# Patient Record
Sex: Female | Born: 1980 | Race: Black or African American | Hispanic: No | Smoking: Current every day smoker
Health system: Southern US, Community
[De-identification: ages and names within clinical notes are randomized; demographics above are authoritative.]

## PROBLEM LIST (undated history)

## (undated) ENCOUNTER — Inpatient Hospital Stay (HOSPITAL_COMMUNITY): Payer: Self-pay

## (undated) DIAGNOSIS — F329 Major depressive disorder, single episode, unspecified: Secondary | ICD-10-CM

## (undated) DIAGNOSIS — K219 Gastro-esophageal reflux disease without esophagitis: Secondary | ICD-10-CM

## (undated) DIAGNOSIS — F319 Bipolar disorder, unspecified: Secondary | ICD-10-CM

## (undated) DIAGNOSIS — N946 Dysmenorrhea, unspecified: Secondary | ICD-10-CM

## (undated) DIAGNOSIS — F32A Depression, unspecified: Secondary | ICD-10-CM

## (undated) DIAGNOSIS — IMO0002 Reserved for concepts with insufficient information to code with codable children: Secondary | ICD-10-CM

## (undated) DIAGNOSIS — F419 Anxiety disorder, unspecified: Secondary | ICD-10-CM

## (undated) DIAGNOSIS — D649 Anemia, unspecified: Secondary | ICD-10-CM

## (undated) HISTORY — DX: Bipolar disorder, unspecified: F31.9

## (undated) HISTORY — DX: Dysmenorrhea, unspecified: N94.6

## (undated) HISTORY — DX: Reserved for concepts with insufficient information to code with codable children: IMO0002

---

## 2000-09-10 HISTORY — PX: CHOLECYSTECTOMY: SHX55

## 2001-07-01 ENCOUNTER — Emergency Department (HOSPITAL_COMMUNITY): Admission: EM | Admit: 2001-07-01 | Discharge: 2001-07-01 | Payer: Self-pay | Admitting: Emergency Medicine

## 2001-07-01 ENCOUNTER — Encounter: Payer: Self-pay | Admitting: *Deleted

## 2002-05-20 ENCOUNTER — Emergency Department (HOSPITAL_COMMUNITY): Admission: EM | Admit: 2002-05-20 | Discharge: 2002-05-20 | Payer: Self-pay | Admitting: Emergency Medicine

## 2002-05-21 ENCOUNTER — Encounter: Payer: Self-pay | Admitting: Family Medicine

## 2002-05-21 ENCOUNTER — Ambulatory Visit (HOSPITAL_COMMUNITY): Admission: RE | Admit: 2002-05-21 | Discharge: 2002-05-21 | Payer: Self-pay | Admitting: Family Medicine

## 2002-05-22 ENCOUNTER — Encounter: Payer: Self-pay | Admitting: Internal Medicine

## 2002-05-22 ENCOUNTER — Inpatient Hospital Stay (HOSPITAL_COMMUNITY): Admission: EM | Admit: 2002-05-22 | Discharge: 2002-05-30 | Payer: Self-pay | Admitting: Internal Medicine

## 2002-05-25 ENCOUNTER — Encounter: Payer: Self-pay | Admitting: Family Medicine

## 2002-09-16 ENCOUNTER — Emergency Department (HOSPITAL_COMMUNITY): Admission: EM | Admit: 2002-09-16 | Discharge: 2002-09-16 | Payer: Self-pay | Admitting: Emergency Medicine

## 2003-04-16 ENCOUNTER — Emergency Department (HOSPITAL_COMMUNITY): Admission: EM | Admit: 2003-04-16 | Discharge: 2003-04-16 | Payer: Self-pay | Admitting: Emergency Medicine

## 2003-05-26 ENCOUNTER — Emergency Department (HOSPITAL_COMMUNITY): Admission: EM | Admit: 2003-05-26 | Discharge: 2003-05-26 | Payer: Self-pay | Admitting: Emergency Medicine

## 2003-10-13 ENCOUNTER — Ambulatory Visit (HOSPITAL_COMMUNITY): Admission: RE | Admit: 2003-10-13 | Discharge: 2003-10-13 | Payer: Self-pay | Admitting: Family Medicine

## 2004-03-22 ENCOUNTER — Ambulatory Visit (HOSPITAL_COMMUNITY): Admission: RE | Admit: 2004-03-22 | Discharge: 2004-03-22 | Payer: Self-pay | Admitting: Family Medicine

## 2004-07-04 ENCOUNTER — Ambulatory Visit (HOSPITAL_COMMUNITY): Admission: RE | Admit: 2004-07-04 | Discharge: 2004-07-04 | Payer: Self-pay | Admitting: Family Medicine

## 2004-10-19 ENCOUNTER — Ambulatory Visit: Payer: Self-pay | Admitting: Family Medicine

## 2004-11-16 ENCOUNTER — Ambulatory Visit: Payer: Self-pay | Admitting: Family Medicine

## 2005-01-10 ENCOUNTER — Ambulatory Visit: Payer: Self-pay | Admitting: Family Medicine

## 2005-03-28 ENCOUNTER — Ambulatory Visit: Payer: Self-pay | Admitting: Family Medicine

## 2005-08-16 ENCOUNTER — Ambulatory Visit: Payer: Self-pay | Admitting: Family Medicine

## 2005-09-20 ENCOUNTER — Ambulatory Visit: Payer: Self-pay | Admitting: Family Medicine

## 2005-10-23 ENCOUNTER — Ambulatory Visit: Payer: Self-pay | Admitting: Family Medicine

## 2005-12-11 ENCOUNTER — Ambulatory Visit: Payer: Self-pay | Admitting: Family Medicine

## 2006-01-03 ENCOUNTER — Ambulatory Visit: Payer: Self-pay | Admitting: Family Medicine

## 2006-04-01 ENCOUNTER — Ambulatory Visit: Payer: Self-pay | Admitting: Family Medicine

## 2006-07-01 ENCOUNTER — Ambulatory Visit: Payer: Self-pay | Admitting: Family Medicine

## 2006-10-28 ENCOUNTER — Encounter: Payer: Self-pay | Admitting: Family Medicine

## 2006-10-28 ENCOUNTER — Ambulatory Visit: Payer: Self-pay | Admitting: Family Medicine

## 2006-10-28 ENCOUNTER — Other Ambulatory Visit: Admission: RE | Admit: 2006-10-28 | Discharge: 2006-10-28 | Payer: Self-pay | Admitting: Family Medicine

## 2006-10-29 ENCOUNTER — Encounter: Payer: Self-pay | Admitting: Family Medicine

## 2006-10-29 LAB — CONVERTED CEMR LAB: Candida species: POSITIVE — AB

## 2006-12-31 ENCOUNTER — Ambulatory Visit: Payer: Self-pay | Admitting: Family Medicine

## 2007-01-01 ENCOUNTER — Encounter: Payer: Self-pay | Admitting: Family Medicine

## 2007-01-01 LAB — CONVERTED CEMR LAB
GC Probe Amp, Genital: NEGATIVE
Gardnerella vaginalis: POSITIVE — AB
Trichomonal Vaginitis: NEGATIVE

## 2007-04-09 ENCOUNTER — Ambulatory Visit: Payer: Self-pay | Admitting: Family Medicine

## 2007-05-28 ENCOUNTER — Ambulatory Visit: Payer: Self-pay | Admitting: Family Medicine

## 2007-05-29 ENCOUNTER — Encounter: Payer: Self-pay | Admitting: Family Medicine

## 2007-05-29 LAB — CONVERTED CEMR LAB
Candida species: NEGATIVE
Chlamydia, DNA Probe: NEGATIVE

## 2007-06-16 ENCOUNTER — Ambulatory Visit: Payer: Self-pay | Admitting: Family Medicine

## 2007-09-23 ENCOUNTER — Ambulatory Visit: Payer: Self-pay | Admitting: Family Medicine

## 2007-09-23 LAB — CONVERTED CEMR LAB
ALT: 11 units/L (ref 0–35)
AST: 14 units/L (ref 0–37)
BUN: 14 mg/dL (ref 6–23)
Bilirubin, Direct: 0.1 mg/dL (ref 0.0–0.3)
Calcium: 9.7 mg/dL (ref 8.4–10.5)
Cholesterol: 153 mg/dL (ref 0–200)
Creatinine, Ser: 0.77 mg/dL (ref 0.40–1.20)
Eosinophils Absolute: 0.1 10*3/uL (ref 0.0–0.7)
Eosinophils Relative: 1 % (ref 0–5)
Glucose, Bld: 93 mg/dL (ref 70–99)
HCT: 42.9 % (ref 36.0–46.0)
Helicobacter Pylori Antibody-IgG: <0.4
Hemoglobin: 14.6 g/dL (ref 12.0–15.0)
Indirect Bilirubin: 0.4 mg/dL (ref 0.0–0.9)
Lymphs Abs: 3 10*3/uL (ref 0.7–4.0)
MCV: 96.2 fL (ref 78.0–100.0)
Monocytes Absolute: 0.6 10*3/uL (ref 0.1–1.0)
Monocytes Relative: 7 % (ref 3–12)
Platelets: 294 10*3/uL (ref 150–400)
RBC: 4.46 M/uL (ref 3.87–5.11)
Total Bilirubin: 0.5 mg/dL (ref 0.3–1.2)
WBC: 9.2 10*3/uL (ref 4.0–10.5)

## 2007-09-24 ENCOUNTER — Encounter: Payer: Self-pay | Admitting: Family Medicine

## 2007-09-24 LAB — CONVERTED CEMR LAB
Candida species: NEGATIVE
Gardnerella vaginalis: POSITIVE — AB
Trichomonal Vaginitis: NEGATIVE

## 2007-09-25 ENCOUNTER — Encounter: Payer: Self-pay | Admitting: Family Medicine

## 2007-11-12 ENCOUNTER — Ambulatory Visit: Payer: Self-pay | Admitting: Family Medicine

## 2007-11-12 ENCOUNTER — Encounter: Payer: Self-pay | Admitting: Family Medicine

## 2007-11-12 ENCOUNTER — Other Ambulatory Visit: Admission: RE | Admit: 2007-11-12 | Discharge: 2007-11-12 | Payer: Self-pay | Admitting: Family Medicine

## 2007-11-13 ENCOUNTER — Encounter: Payer: Self-pay | Admitting: Family Medicine

## 2007-11-13 LAB — CONVERTED CEMR LAB
Candida species: POSITIVE — AB
GC Probe Amp, Genital: NEGATIVE

## 2008-02-12 DIAGNOSIS — E669 Obesity, unspecified: Secondary | ICD-10-CM | POA: Insufficient documentation

## 2008-02-12 DIAGNOSIS — F329 Major depressive disorder, single episode, unspecified: Secondary | ICD-10-CM | POA: Insufficient documentation

## 2008-02-12 DIAGNOSIS — R109 Unspecified abdominal pain: Secondary | ICD-10-CM | POA: Insufficient documentation

## 2008-02-12 DIAGNOSIS — K3189 Other diseases of stomach and duodenum: Secondary | ICD-10-CM | POA: Insufficient documentation

## 2008-02-12 DIAGNOSIS — M67919 Unspecified disorder of synovium and tendon, unspecified shoulder: Secondary | ICD-10-CM | POA: Insufficient documentation

## 2008-02-12 DIAGNOSIS — R1013 Epigastric pain: Secondary | ICD-10-CM

## 2008-02-12 DIAGNOSIS — N760 Acute vaginitis: Secondary | ICD-10-CM | POA: Insufficient documentation

## 2008-02-12 DIAGNOSIS — F3289 Other specified depressive episodes: Secondary | ICD-10-CM | POA: Insufficient documentation

## 2008-02-12 DIAGNOSIS — M719 Bursopathy, unspecified: Secondary | ICD-10-CM

## 2008-02-12 DIAGNOSIS — F17200 Nicotine dependence, unspecified, uncomplicated: Secondary | ICD-10-CM | POA: Insufficient documentation

## 2008-04-14 ENCOUNTER — Ambulatory Visit: Payer: Self-pay | Admitting: Family Medicine

## 2008-04-14 ENCOUNTER — Encounter: Payer: Self-pay | Admitting: Family Medicine

## 2008-04-14 DIAGNOSIS — R110 Nausea: Secondary | ICD-10-CM

## 2008-08-16 ENCOUNTER — Ambulatory Visit: Payer: Self-pay | Admitting: Family Medicine

## 2008-08-16 DIAGNOSIS — N3000 Acute cystitis without hematuria: Secondary | ICD-10-CM | POA: Insufficient documentation

## 2008-08-16 DIAGNOSIS — IMO0002 Reserved for concepts with insufficient information to code with codable children: Secondary | ICD-10-CM

## 2008-08-16 DIAGNOSIS — M1710 Unilateral primary osteoarthritis, unspecified knee: Secondary | ICD-10-CM

## 2008-08-16 HISTORY — DX: Unilateral primary osteoarthritis, unspecified knee: M17.10

## 2008-08-16 LAB — CONVERTED CEMR LAB
Glucose, Urine, Semiquant: NEGATIVE
Ketones, urine, test strip: NEGATIVE
Protein, U semiquant: NEGATIVE
WBC Urine, dipstick: NEGATIVE

## 2008-10-13 ENCOUNTER — Encounter: Payer: Self-pay | Admitting: Orthopedic Surgery

## 2008-10-14 ENCOUNTER — Inpatient Hospital Stay (HOSPITAL_COMMUNITY): Admission: AD | Admit: 2008-10-14 | Discharge: 2008-10-14 | Payer: Self-pay | Admitting: Obstetrics & Gynecology

## 2008-10-16 ENCOUNTER — Inpatient Hospital Stay (HOSPITAL_COMMUNITY): Admission: AD | Admit: 2008-10-16 | Discharge: 2008-10-16 | Payer: Self-pay | Admitting: Obstetrics & Gynecology

## 2008-10-19 ENCOUNTER — Emergency Department (HOSPITAL_COMMUNITY): Admission: EM | Admit: 2008-10-19 | Discharge: 2008-10-19 | Payer: Self-pay | Admitting: Emergency Medicine

## 2008-10-24 ENCOUNTER — Inpatient Hospital Stay (HOSPITAL_COMMUNITY): Admission: AD | Admit: 2008-10-24 | Discharge: 2008-10-24 | Payer: Self-pay | Admitting: Obstetrics & Gynecology

## 2009-02-04 ENCOUNTER — Ambulatory Visit: Payer: Self-pay | Admitting: Family Medicine

## 2009-02-04 LAB — CONVERTED CEMR LAB
BUN: 13 mg/dL (ref 6–23)
Basophils Absolute: 0 10*3/uL (ref 0.0–0.1)
CO2: 21 meq/L (ref 19–32)
Chloride: 106 meq/L (ref 96–112)
Cholesterol: 148 mg/dL (ref 0–200)
Eosinophils Relative: 2 % (ref 0–5)
Glucose, Bld: 99 mg/dL (ref 70–99)
HCT: 42.1 % (ref 36.0–46.0)
Hemoglobin: 13.5 g/dL (ref 12.0–15.0)
LDL Cholesterol: 94 mg/dL (ref 0–99)
Lymphocytes Relative: 24 % (ref 12–46)
Lymphs Abs: 2.3 10*3/uL (ref 0.7–4.0)
Monocytes Absolute: 0.8 10*3/uL (ref 0.1–1.0)
Monocytes Relative: 8 % (ref 3–12)
Neutro Abs: 6.4 10*3/uL (ref 1.7–7.7)
Potassium: 4.9 meq/L (ref 3.5–5.3)
RBC: 4.36 M/uL (ref 3.87–5.11)
RDW: 14.3 % (ref 11.5–15.5)
Sodium: 142 meq/L (ref 135–145)
TSH: 0.481 microintl units/mL (ref 0.350–4.500)
Total CHOL/HDL Ratio: 3.1
VLDL: 7 mg/dL (ref 0–40)

## 2009-02-05 ENCOUNTER — Encounter: Payer: Self-pay | Admitting: Family Medicine

## 2009-02-05 LAB — CONVERTED CEMR LAB
Chlamydia, DNA Probe: NEGATIVE
GC Probe Amp, Genital: NEGATIVE

## 2009-02-08 LAB — CONVERTED CEMR LAB: Gardnerella vaginalis: POSITIVE — AB

## 2009-04-19 ENCOUNTER — Inpatient Hospital Stay (HOSPITAL_COMMUNITY): Admission: AD | Admit: 2009-04-19 | Discharge: 2009-04-19 | Payer: Self-pay | Admitting: Obstetrics & Gynecology

## 2009-05-31 ENCOUNTER — Other Ambulatory Visit: Admission: RE | Admit: 2009-05-31 | Discharge: 2009-05-31 | Payer: Self-pay | Admitting: Obstetrics and Gynecology

## 2009-06-17 ENCOUNTER — Ambulatory Visit (HOSPITAL_COMMUNITY): Admission: RE | Admit: 2009-06-17 | Discharge: 2009-06-17 | Payer: Self-pay | Admitting: Obstetrics & Gynecology

## 2009-07-30 ENCOUNTER — Ambulatory Visit: Payer: Self-pay | Admitting: Advanced Practice Midwife

## 2009-07-30 ENCOUNTER — Inpatient Hospital Stay (HOSPITAL_COMMUNITY): Admission: AD | Admit: 2009-07-30 | Discharge: 2009-07-30 | Payer: Self-pay | Admitting: Obstetrics & Gynecology

## 2009-08-20 ENCOUNTER — Ambulatory Visit: Payer: Self-pay | Admitting: Family Medicine

## 2009-08-20 ENCOUNTER — Inpatient Hospital Stay (HOSPITAL_COMMUNITY): Admission: AD | Admit: 2009-08-20 | Discharge: 2009-08-20 | Payer: Self-pay | Admitting: Obstetrics & Gynecology

## 2009-10-08 ENCOUNTER — Ambulatory Visit: Payer: Self-pay | Admitting: Advanced Practice Midwife

## 2009-10-08 ENCOUNTER — Inpatient Hospital Stay (HOSPITAL_COMMUNITY): Admission: AD | Admit: 2009-10-08 | Discharge: 2009-10-08 | Payer: Self-pay | Admitting: Obstetrics & Gynecology

## 2009-10-11 ENCOUNTER — Ambulatory Visit: Payer: Self-pay | Admitting: Advanced Practice Midwife

## 2009-10-11 ENCOUNTER — Inpatient Hospital Stay (HOSPITAL_COMMUNITY): Admission: AD | Admit: 2009-10-11 | Discharge: 2009-10-14 | Payer: Self-pay | Admitting: Obstetrics & Gynecology

## 2010-01-11 ENCOUNTER — Ambulatory Visit: Payer: Self-pay | Admitting: Family Medicine

## 2010-01-11 DIAGNOSIS — R5381 Other malaise: Secondary | ICD-10-CM

## 2010-01-11 DIAGNOSIS — R5383 Other fatigue: Secondary | ICD-10-CM

## 2010-01-19 ENCOUNTER — Encounter: Payer: Self-pay | Admitting: Family Medicine

## 2010-01-20 LAB — CONVERTED CEMR LAB
Alkaline Phosphatase: 76 units/L (ref 39–117)
BUN: 15 mg/dL (ref 6–23)
Basophils Absolute: 0 10*3/uL (ref 0.0–0.1)
Bilirubin, Direct: <0.1 mg/dL (ref 0.0–0.3)
Chloride: 104 meq/L (ref 96–112)
Creatinine, Ser: 0.66 mg/dL (ref 0.40–1.20)
Eosinophils Absolute: 0.1 10*3/uL (ref 0.0–0.7)
Eosinophils Relative: 1 % (ref 0–5)
Glucose, Bld: 86 mg/dL (ref 70–99)
HCT: 37.8 % (ref 36.0–46.0)
Hemoglobin: 12.6 g/dL (ref 12.0–15.0)
LDL Cholesterol: 90 mg/dL (ref 0–99)
Lymphocytes Relative: 35 % (ref 12–46)
MCV: 92 fL (ref 78.0–100.0)
Monocytes Absolute: 0.5 10*3/uL (ref 0.1–1.0)
Platelets: 336 10*3/uL (ref 150–400)
Potassium: 4.2 meq/L (ref 3.5–5.3)
RDW: 13.3 % (ref 11.5–15.5)
VLDL: 9 mg/dL (ref 0–40)
Vit D, 25-Hydroxy: 20 ng/mL — ABNORMAL LOW (ref 30–89)

## 2010-02-02 IMAGING — CR DG LUMBAR SPINE COMPLETE 4+V
5 series · 5 of 5 positions shown · non-contrast
Comparison: CT abdomen and pelvis 10/13/2003 reviewed.

CLINICAL DATA: Low back pain.

LUMBAR SPINE - COMPLETE 4+ VIEW

[t l-spine a.p.]
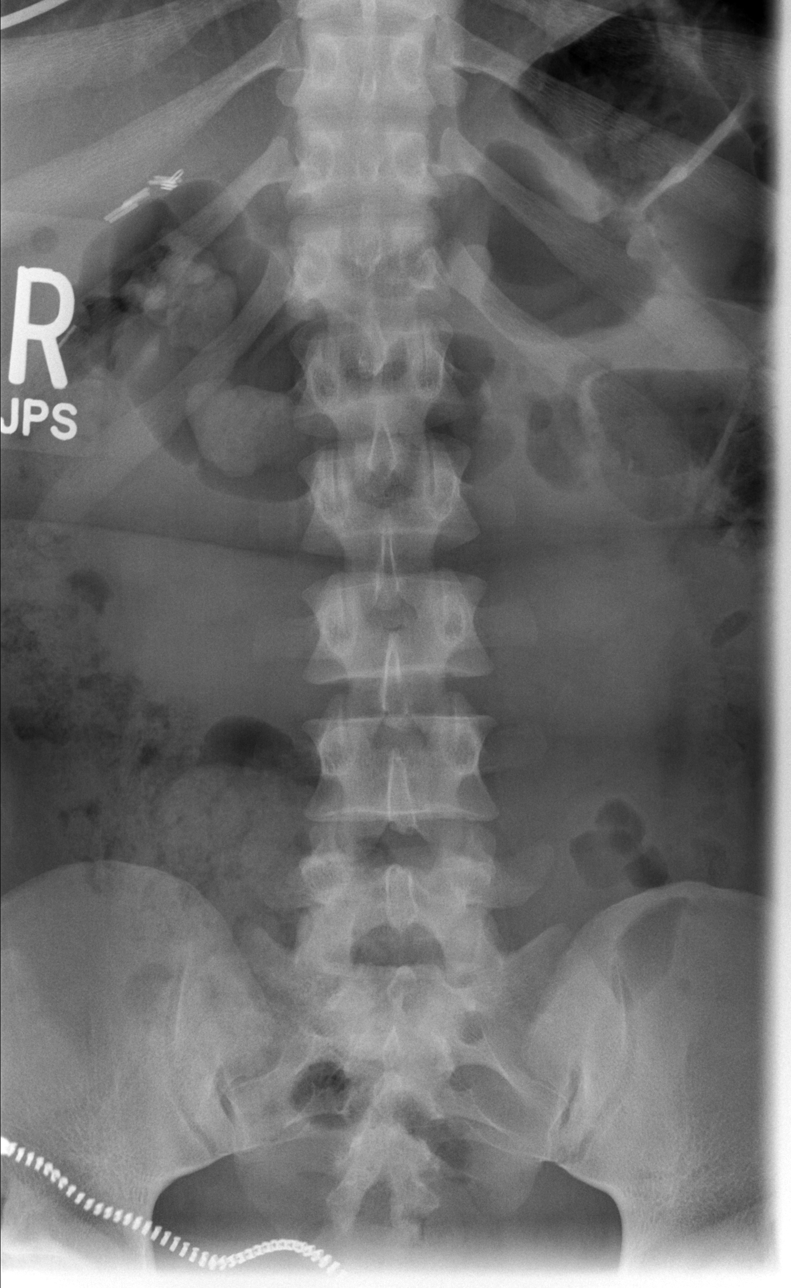

[t l-spine oblique exposure (1 of 2)]
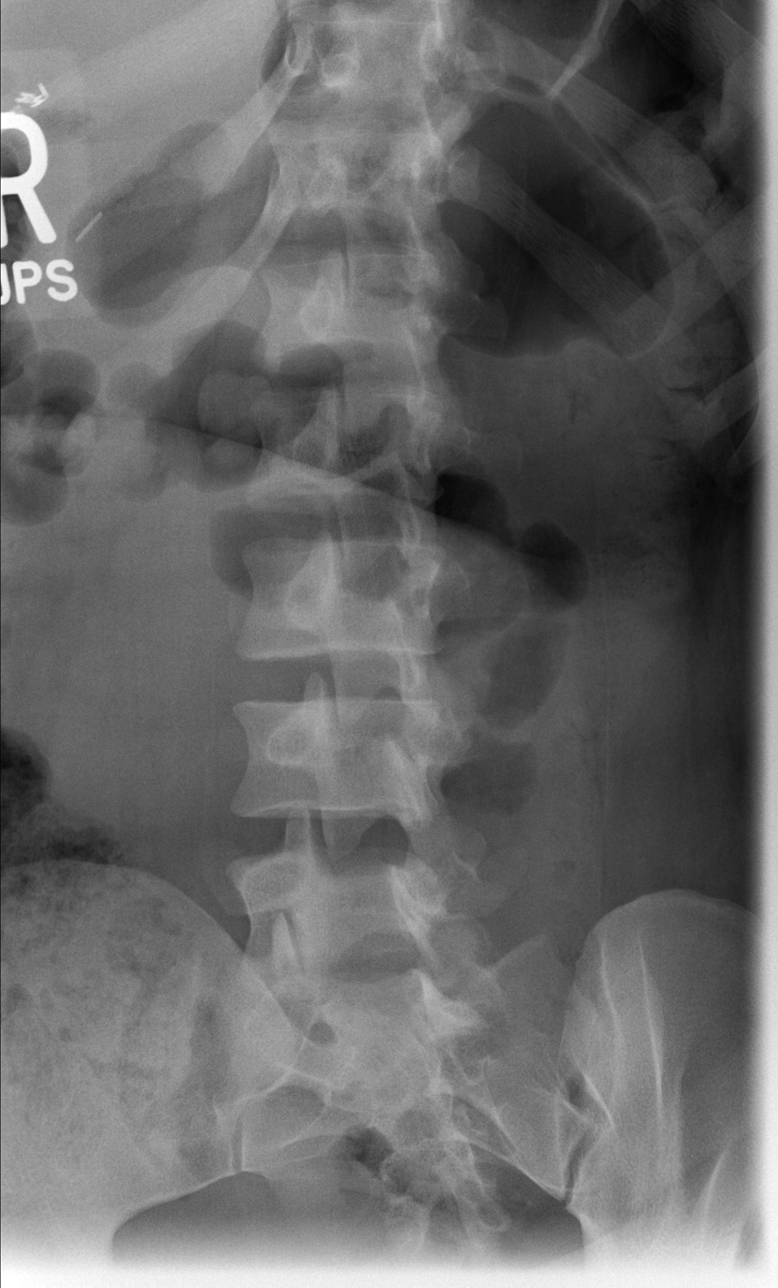

[t l-spine oblique exposure (2 of 2)]
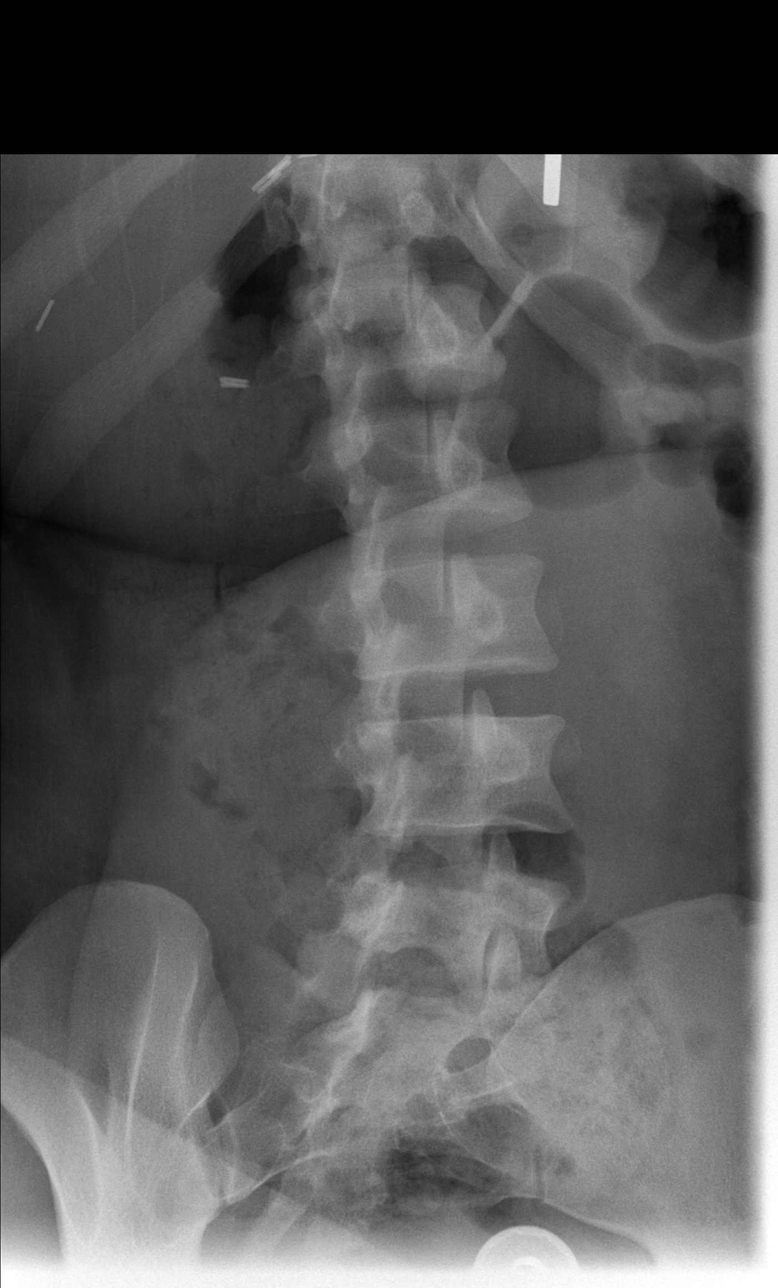

[t l-spine lat]
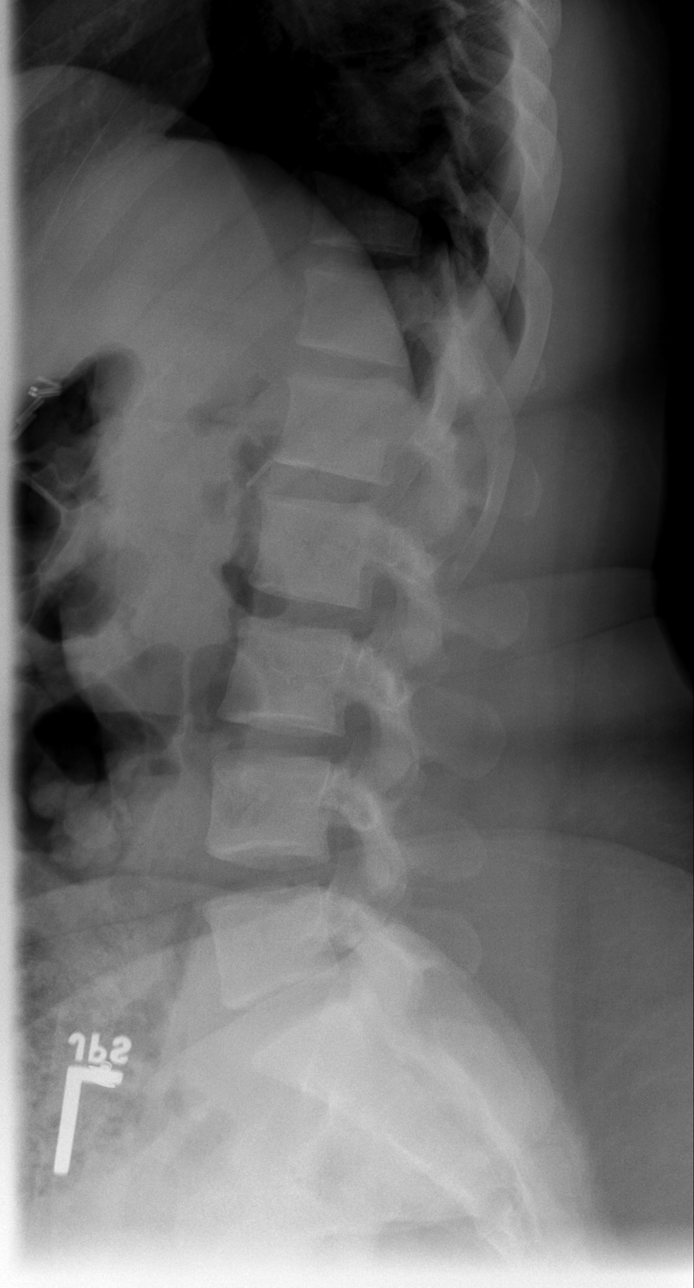

[t l-spine l5-s1 spot]
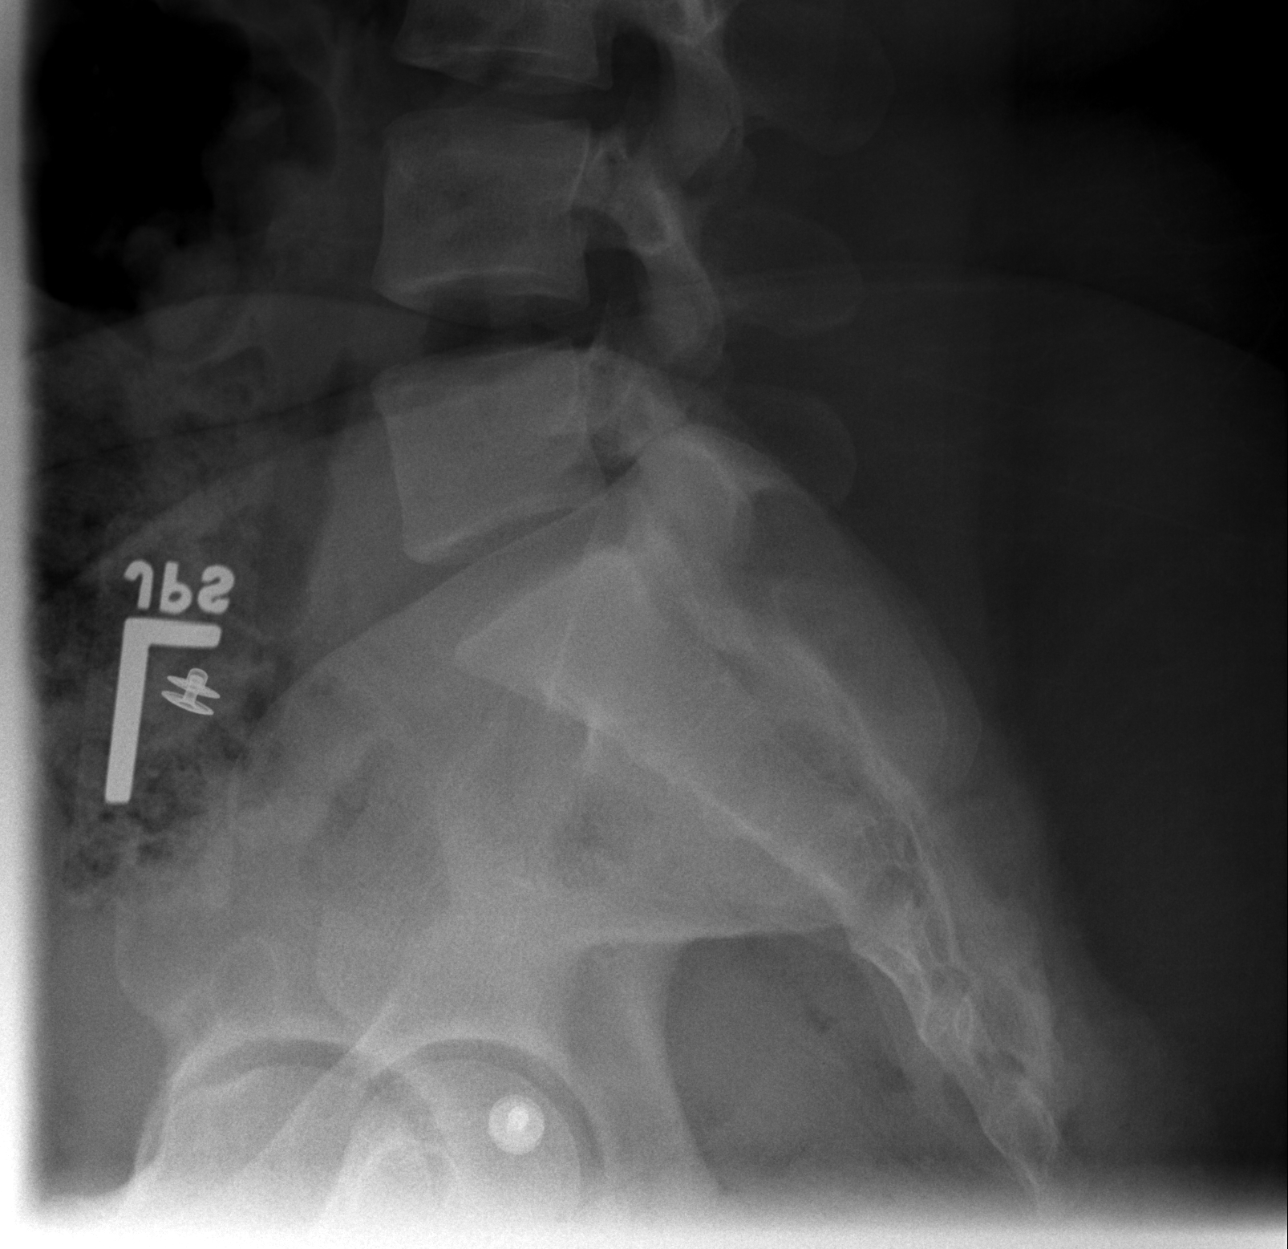

[5 of 5 positions shown; findings below may reference images not displayed]

FINDINGS: Vertebral body height and alignment are normal.
Intervertebral disc space height is maintained.  No pars
interarticularis defect is identified.  Imaged soft tissue
structures appear normal. Surgical clips in the right upper
quadrant from cholecystectomy noted.
IMPRESSION: Normal exam.

REF:Z9 DICTATED: 10/19/2008 [DATE]

## 2010-09-30 ENCOUNTER — Encounter: Payer: Self-pay | Admitting: Family Medicine

## 2010-10-01 ENCOUNTER — Encounter: Payer: Self-pay | Admitting: Family Medicine

## 2010-10-10 NOTE — Assessment & Plan Note (Signed)
Summary: office visit   Vital Signs:  Patient profile:   30 year old female Menstrual status:  regular LMP:     01/08/2010 Height:      60.5 inches Weight:      193.50 pounds BMI:     37.30 O2 Sat:      98 % Pulse rate:   78 / minute Pulse rhythm:   regular Resp:     16 per minute BP sitting:   122 / 84  (left arm) Cuff size:   large  Vitals Entered By: Everitt Amber LPN (Jan 11, 1609 2:48 PM)  Nutrition Counseling: Patient's BMI is greater than 25 and therefore counseled on weight management options. CC: Follow up chronic problems LMP (date): 01/08/2010     Enter LMP: 01/08/2010   CC:  Follow up chronic problems.  History of Present Illness: Pt has noted  total fatigue , loss of interest in everything and all the classic symptoms of depression which have gradually worsened in the pastyear. she has an unplanned 3 month infant  girl who is healthyshe has benefited from antidepressant in the  pasdt, she is smoking again, half pPD and is unwilling to thinking about quitting at this time.  Preventive Screening-Counseling & Management  Alcohol-Tobacco     Smoking Cessation Counseling: yes  Current Medications (verified): 1)  None  Allergies (verified): No Known Drug Allergies  Review of Systems      See HPI General:  Complains of fatigue, malaise, and sleep disorder; denies chills and fever; sleeping excessively. Eyes:  Denies blurring and discharge. ENT:  Denies hoarseness, nasal congestion, and sinus pressure. CV:  Denies chest pain or discomfort, palpitations, and swelling of feet. Resp:  Denies cough, shortness of breath, sputum productive, and wheezing. GI:  Denies abdominal pain, constipation, diarrhea, nausea, and vomiting. GU:  Denies genital sores and urinary frequency. MS:  Denies joint pain and stiffness. Derm:  Denies itching, lesion(s), and rash. Neuro:  Denies falling down, headaches, seizures, sensation of room spinning, and tingling. Psych:  Complains  of anxiety, depression, easily angered, easily tearful, irritability, mental problems, and thoughts of violence; denies suicidal thoughts/plans and unusual visions or sounds. Endo:  Complains of weight change; denies excessive hunger, excessive thirst, and excessive urination. Heme:  Denies abnormal bruising and bleeding. Allergy:  Complains of seasonal allergies; denies hives or rash.  Physical Exam  General:  Well-developed,obese,in no acute distress; alert,appropriate and cooperative throughout examination HEENT: No facial asymmetry,  EOMI, No sinus tenderness, TM's Clear, oropharynx  pink and moist.   Chest: Clear to auscultation bilaterally.  CVS: S1, S2, No murmurs, No S3.   Abd: Soft, Nontender.  MS: Adequate ROM spine, hips, shoulders and knees.  Ext: No edema.   CNS: CN 2-12 intact, power tone and sensation normal throughout.   Skin: Intact, no visible lesions or rashes.  Psych: Good eye contact, normal affect.  Memory intact, not anxious or depressed appearing.    Impression & Recommendations:  Problem # 1:  FATIGUE (ICD-780.79) Assessment Deteriorated  Orders: T-Basic Metabolic Panel [id number]) T-CBC w/Diff [phone]) T-TSH [phone])  Problem # 2:  OBESITY (ICD-278.00) Assessment: Deteriorated  Ht: 60.5 (01/11/2010)   Wt: 193.50 (01/11/2010)   BMI: 37.30 (01/11/2010), pt encouraged to start regular physical activity and reduce caloric intake to facilitate weight loss  Problem # 3:  DEPRESSION (ICD-311) Assessment: Deteriorated  Her updated medication list for this problem includes:    Lexapro 10 Mg Tabs (Escitalopram oxalate) .Marland Kitchen... Take 1  tablet by mouth once a day  Orders: Psychology Referral (Psychology)  Problem # 4:  NICOTINE ADDICTION (ICD-305.1) Assessment: Deteriorated  Encouraged smoking cessation and discussed different methods for smoking cessation.   Complete Medication List: 1)  Lexapro 10 Mg Tabs (Escitalopram oxalate) .... Take 1  tablet by mouth once a day 2)  Tri-sprintec 0.18/0.215/0.25 Mg-35 Mcg Tabs (Norgestim-eth estrad triphasic) .... Take 1 tablet by mouth once a day  Other Orders: T-Hepatic Function [id number]) T-Lipid Profile [phone]) T-Vitamin D (25-Hydroxy) [phone])  Patient Instructions: 1)  Please schedule a follow-up appointment in 2 months. 2)  Tobacco is very bad for your health and your loved ones! You Should stop smoking!. 3)  Stop Smoking Tips: Choose a Quit date. Cut down before the Quit date. decide what you will do as a substitute when you feel the urge to smoke(gum,toothpick,exercise). 4)  It is important that you exercise regularly at least 20 minutes 5 times a week. If you develop chest pain, have severe difficulty breathing, or feel very tired , stop exercising immediately and seek medical attention. 5)  You need to lose weight. Consider a lower calorie diet and regular exercise.  6)  BMP prior to visit, ICD-9: 7)  Hepatic Panel prior to visit, ICD-9: 8)  Lipid Panel prior to visit, ICD-9: 9)  TSH prior to visit, ICD-9:  fasting today 10)  CBC w/ Diff prior to visit, ICD-9: 11)  vitamin d 12)  New med for depression Prescriptions: TRI-SPRINTEC 0.18/0.215/0.25 MG-35 MCG TABS (NORGESTIM-ETH ESTRAD TRIPHASIC) Take 1 tablet by mouth once a day  #28 x 5   Entered by:   Everitt Amber LPN   Authorized by:   Syliva Overman MD   Signed by:   Everitt Amber LPN on 66/44/0347   Method used:   Electronically to        [facility] [phone]* (retail)       [address]       [place], [place]  [address]       Ph: [phone] or [phone]       Fax: [phone]   RxID:   [id number] TRI-SPRINTEC 0.18/0.215/0.25 MG-35 MCG TABS (NORGESTIM-ETH ESTRAD TRIPHASIC) Take 1 tablet by mouth once a day  #28 x 5   Entered by:   Everitt Amber LPN   Authorized by:   Syliva Overman MD   Signed by:   Everitt Amber LPN on 83/15/1761   Method used:   Printed then  faxed to ...       [facility]  [address] [phone]* (retail)       [address]       [place], [place]  [address]       Ph: [phone] or [phone]       Fax: [phone]   RxID:   [id number] LEXAPRO 10 MG TABS (ESCITALOPRAM OXALATE) Take 1 tablet by mouth once a day  #30 x 2   Entered and Authorized by:   Syliva Overman MD   Signed by:   Syliva Overman MD on 01/11/2010   Method used:   Electronically to        [facility] [phone]* (retail)       [address]       [place], [place]  [address]  Ph: [phone] or [phone]       Fax: [phone]   RxID:   [id number]

## 2010-10-10 NOTE — Letter (Signed)
Summary: Letter  Letter   Imported By: Lind Guest 01/19/2010 10:33:01  _____________________________________________________________________  External Attachment:    Type:   Image     Comment:   External Document

## 2010-11-29 LAB — CBC
HCT: 29.7 % — ABNORMAL LOW (ref 36.0–46.0)
HCT: 36.3 % (ref 36.0–46.0)
Hemoglobin: 10 g/dL — ABNORMAL LOW (ref 12.0–15.0)
Hemoglobin: 12.2 g/dL (ref 12.0–15.0)
MCHC: 33.5 g/dL (ref 30.0–36.0)
MCV: 98.3 fL (ref 78.0–100.0)
RBC: 3.03 MIL/uL — ABNORMAL LOW (ref 3.87–5.11)
RBC: 3.69 MIL/uL — ABNORMAL LOW (ref 3.87–5.11)
WBC: 14.2 10*3/uL — ABNORMAL HIGH (ref 4.0–10.5)

## 2010-12-12 LAB — URINALYSIS, ROUTINE W REFLEX MICROSCOPIC
Glucose, UA: NEGATIVE mg/dL
Protein, ur: NEGATIVE mg/dL
Specific Gravity, Urine: 1.025 (ref 1.005–1.030)
pH: 6 (ref 5.0–8.0)

## 2010-12-12 LAB — URINE MICROSCOPIC-ADD ON

## 2010-12-12 LAB — WET PREP, GENITAL: Trich, Wet Prep: NONE SEEN

## 2010-12-13 LAB — WET PREP, GENITAL
Trich, Wet Prep: NONE SEEN
Yeast Wet Prep HPF POC: NONE SEEN

## 2010-12-13 LAB — GC/CHLAMYDIA PROBE AMP, GENITAL: Chlamydia, DNA Probe: NEGATIVE

## 2010-12-16 LAB — WET PREP, GENITAL

## 2010-12-16 LAB — URINE CULTURE: Colony Count: 30000

## 2010-12-16 LAB — URINALYSIS, ROUTINE W REFLEX MICROSCOPIC
Bilirubin Urine: NEGATIVE
Ketones, ur: NEGATIVE mg/dL
Nitrite: NEGATIVE
Protein, ur: NEGATIVE mg/dL
pH: 7 (ref 5.0–8.0)

## 2010-12-16 LAB — URINE MICROSCOPIC-ADD ON

## 2010-12-16 LAB — GC/CHLAMYDIA PROBE AMP, GENITAL
Chlamydia, DNA Probe: NEGATIVE
GC Probe Amp, Genital: NEGATIVE

## 2010-12-26 LAB — URINE MICROSCOPIC-ADD ON

## 2010-12-26 LAB — URINALYSIS, ROUTINE W REFLEX MICROSCOPIC
Hgb urine dipstick: NEGATIVE
Leukocytes, UA: NEGATIVE
Nitrite: NEGATIVE
Protein, ur: NEGATIVE mg/dL
Specific Gravity, Urine: 1.036 — ABNORMAL HIGH (ref 1.005–1.030)
Urobilinogen, UA: 1 mg/dL (ref 0.0–1.0)
Urobilinogen, UA: 0.2 mg/dL (ref 0.0–1.0)

## 2010-12-26 LAB — URINE CULTURE: Colony Count: 45000

## 2010-12-26 LAB — HCG, QUANTITATIVE, PREGNANCY
hCG, Beta Chain, Quant, S: 1864 m[IU]/mL — ABNORMAL HIGH (ref <5)
hCG, Beta Chain, Quant, S: 418 m[IU]/mL — ABNORMAL HIGH (ref <5)

## 2010-12-26 LAB — WET PREP, GENITAL: Clue Cells Wet Prep HPF POC: NONE SEEN

## 2010-12-26 LAB — CBC
MCHC: 33.6 g/dL (ref 30.0–36.0)
MCV: 97.1 fL (ref 78.0–100.0)
Platelets: 228 10*3/uL (ref 150–400)

## 2010-12-26 LAB — POCT PREGNANCY, URINE: Preg Test, Ur: POSITIVE

## 2011-01-26 NOTE — Op Note (Signed)
   NAMEKRITHI, BRAY NO.:  [id number]   MEDICAL RECORD NO.:  [id number]                   PATIENT TYPE:   LOCATION:                                       FACILITY:   PHYSICIAN:  Dirk Dress. Katrinka Blazing, M.D.                DATE OF BIRTH:   DATE OF PROCEDURE:  05/29/2002  DATE OF DISCHARGE:                                 OPERATIVE REPORT   PREOPERATIVE DIAGNOSES:  1. Chronic biliary colic.  2. Chronic cholecystitis.   POSTOPERATIVE DIAGNOSES:  1. Chronic biliary colic.  2. Chronic cholecystitis.   PROCEDURE:  Laparoscopic cholecystectomy.   SURGEON:  Dirk Dress. Katrinka Blazing, M.D.   DESCRIPTION OF PROCEDURE:  Under general endotracheal anesthesia the  patient's abdomen was prepped and draped in a sterile field.  A  supraumbilical incision was made.  A Veress needle was inserted and position  was confirmed by the saline drop test.  The abdomen was insufflated with 3  liters of CO2.  Using a Visiport guide a 10 mm port was placed uneventfully.  A laparoscope was placed; and the gallbladder was visualized.   Standard incisions were made in the right upper quadrant for a 10-mm port  and two 5-mm ports.  They were placed under videoscopic guidance.  The  gallbladder was positioned.  There was subacute inflammation and thickening  of the gallbladder with a very large node.  The cystic duct was dissected,  clipped with 4 clips and divided.  There were 2 major cystic artery  branches.  There was an anterior branch that was clipped with 4 clips and  divided.  There was a posterior branch which was dissected free.  It sent 3  small branches to the wall of gallbladder  These branches were clipped and  divided.  Next the gallbladder was separated from the infrahepatic space  using electrocautery.  The gallbladder was retrieved intact.   Irrigation was carried out.  There was no bleeding from the bed.  There was  no need for a drain.  The patient tolerated the procedure  well.  CO2 was  allowed to escape from the abdomen and the ports were removed.  The  incisions were closed using #0 Dexon on the fascia of the larger incisions  and staples on the skin.  Dressings were placed.  She was awakened from  anesthesia, transferred to a bed, and taken to the postanesthetic care unit.                                               Dirk Dress. Katrinka Blazing, M.D.    LCS/MEDQ  D:  05/29/2002  T:  06/01/2002  Job:  [id number]

## 2011-01-26 NOTE — H&P (Signed)
NAME:  Brianna Sampson, Brianna Sampson                        ACCOUNT NO.:  [id number]   MEDICAL RECORD NO.:  [id number]                   PATIENT TYPE:  INP   LOCATION:  A310                                 FACILITY:  [facility]   PHYSICIAN:  Annia Friendly. Loleta Chance, M.D.                DATE OF BIRTH:  [birth year]   DATE OF ADMISSION:  05/22/2002  DATE OF DISCHARGE:                                HISTORY & PHYSICAL   IDENTIFICATION:  The patient is a 30 year old female, gravida 5, para 3, AB  2, unemployed black female from [place], [place].  The patient had  a miscarriage with her fifth pregnancy at eight weeks and an elective  abortion with her third pregnancy at eight weeks.   CHIEF COMPLAINT:  Lower back pain and right side pain x4 days.   HISTORY OF PRESENT ILLNESS:  Pain was described as sharp and constant.  Moreover, the patient notices bad-smelling urine x1 day.  She also incurred  some nausea and vomiting on the morning of admission.  The patient was seen  in the office by her primary caretaker, Milus Mallick. Lodema Hong, M.D., on  05/19/02, and on the morning of admission.  According to the patient, she was  sent to the emergency room after evaluation by Dr. Lodema Hong in the office for  further tests.  The patient denied increased frequency of voiding, gross  hematuria, and pain with voiding.  She did incur some chills.  In the  emergency room an ultrasound of the kidneys was done and demonstrated  findings consistent with increased echogenicity involving the upper pole of  the right kidney revealing questionable pyelonephritis.  Based on the  emergency room physician, a CT of the abdomen was done to confirm right  pyelonephritis versus early appendicitis.  CT of the abdomen revealed  findings consistent with right pyelonephritis as read by Dr. Juan Quam  without hydronephrosis or stones.  Dr. Pia Mau, the radiologist, observed  slight constipation.   PAST MEDICAL HISTORY:  Negative for  hypertension, diabetes, tuberculosis,  cancer, sickle cell, asthma, or seizure disorder.  Past medical history is  positive for hospitalization for pregnancy, C-section with second pregnancy  due to failure to progress.   MEDICATIONS:  Described medications are Zoloft 50 mg p.o. every day, Xenical  120 mg p.o. t.i.d., and a birth control patch.   ALLERGIES:  The patient is not allergic to any known medications.   HABITS:  Habits are positive for cigarette smoking, currently smoking since  age 63, smokes 10 cigarettes per day.  Habits are negative for ethanol or  street drugs.   FAMILY HISTORY:  Mother living at age 14 with history of hypertension and  diabetes.  Father deceased at age 27 secondary to homicide.  Two sisters  living at age 58 in good health, age 32 in good health.  Two brothers  living, age 52 in good health, age 15 in  good health.   GYNECOLOGIC HISTORY:  Sexually transmitted disease history is negative for  gonorrhea, syphilis, herpes, and HIV infection.  Last menstrual cycle  started on 05/18/02.  Duration of menstrual cycle is usually five days.  The  patient denies a past history of significant menstrual cramps or significant  menstrual flow.   REVIEW OF SYMPTOMS:  Negative for chronic headache, epistaxis, bleeding  gums, dysphagia, chronic cough, hemoptysis, wheezing, chest pain,  hematemesis, vaginal discharge, vaginal itching, diarrhea, edema of legs,  joint swelling, joint hotness, and weight loss.   PHYSICAL EXAMINATION:  VITAL SIGNS:  Temperature 100.6, pulse 89,  respirations 20, blood pressure 108/64.  GENERAL:  A well-developed, overweight, medium-height, alert black female  who appeared not to feel well but in no respiratory distress.  SKIN:  Warm and dry.  HEENT:  Head:  Normocephalic.  Ears:  Normal auricles, external canals  patent.  Tympanic membranes pearly-gray.  Eyes:  Lids negative for ptosis.  Sclerae are white.  Pupils round, equal, and  reactive to light.  Extraocular  movements intact.  Nose:  Negative for discharge.  Mouth:  Dentition good.  Tongue positive for ring.  Posterior pharynx benign.  NECK:  Negative for lymphadenopathy, thyromegaly.  Supraclavicular space:  No palpable nodes.  CHEST:  Lungs clear.  BACK:  Right CVA:  Positive tenderness on palpation.  CARDIAC:  Audible S1 and S2 without murmur, rub, or gallop.  Regular rate  and rhythm.  BREASTS:  No skin changes, nipples without discharge.  ABDOMEN:  Obese, hypoactive bowel sounds, soft.  Positive for right lower  quadrant tenderness to deep palpation.  No masses or hepatomegaly.  Hypogastric area for old healed horizontal surgical scar.  Positive for mild  suprapubic tenderness.  EXTREMITIES:  No joint swelling, no joint redness, no joint hotness.  PELVIC:  Deferred.  RECTAL:  Deferred.  NEUROLOGIC:  Alert and oriented to person, place, and time.  Cranial nerves  II-XII appear intact.   ADMISSION LABORATORY DATA:  Urine culture pending.  White count 24.0,  hemoglobin 13.9, hematocrit 40.4, platelets 261,000.  Sodium 136, potassium  3.8, chloride 105, CO2 26, glucose 109, BUN 9, creatinine 1.0, total  bilirubin 0.8, alkaline phosphatase 78, SGOT 13, SGPT 12, total protein 6.7,  albumin 2.7, calcium 8.6.  Serum amylase 38, serum lipase 14.  HCG  quantitative less than 5 MIU/ml.  Urinalysis (specific gravity) white count  less than 3, rbc's less than 3, bacteria rare, blood negative, ketone  negative, bilirubin negative, glucose negative, pH 5.0.   IMPRESSION:  1. Acute right pyelonephritis.  2. Obesity.   PLAN:  Urine culture, blood culture, IV antibiotics using Levaquin and  Rocephin.  A regular diet.  IV fluids.  Erythrocyte sedimentation rate, CBC  every morning x2.                                                 Annia Friendly. Loleta Chance, M.D.    Levonne Hubert  D:  05/22/2002  T:  05/23/2002  Job:  [id number]

## 2011-01-26 NOTE — Consult Note (Signed)
NAME:  Brianna Sampson, CIVIL                        ACCOUNT NO.:  [id number]   MEDICAL RECORD NO.:  [id number]                   PATIENT TYPE:  INP   LOCATION:  A310                                 FACILITY:  [facility]   PHYSICIAN:  Dennie Maizes, M.D.                DATE OF BIRTH:  02-05-1981   DATE OF CONSULTATION:  05/24/2002  DATE OF DISCHARGE:                                   CONSULTATION   CHIEF COMPLAINT:  Severe right flank pain, low back pain, nausea, vomiting,  fever, and chills for about five days.   HISTORY OF PRESENT ILLNESS:  This is a 30 year old female who has been  admitted to the hospital on May 22, 2002 with symptoms suggestive of  acute pyelonephritis.  She has been experiencing severe right flank pain,  low back pain, nausea, vomiting, low grade fever, and chills for about five  days.  She has noticed the urine to be ___________.  She denied having any  voiding difficulty or gross hematuria.  She was seen in the office by Dr.  Lodema Hong on May 19, 2002.  She was sent to the emergency room for  further evaluation.  Ultrasound of the kidneys revealed increased  echogenicity involving the upper pole of the kidneys or history of  pyelonephritis.  CT of abdomen and pelvis without contrast was done.  This  revealed changes of the right kidney suggestive of acute right  pyelonephritis.  There was no evidence of hydronephrosis or stones.  The  patient was admitted to the hospital for further evaluation and management.  I was asked to see the patient for GU evaluation.   PAST MEDICAL HISTORY:  Negative for hypertension, diabetes, status post  cesarean section, history of hospitalization for a urinary tract infection  at age 31.   MEDICATIONS:  1. Zoloft 50 mg one p.o. q.d.  2. Xenical 120 mg p.o. t.i.d.  3. Birth control patch.   ALLERGIES:  None.   FAMILY HISTORY:  Positive for hypertension and diabetes mellitus.   PHYSICAL EXAMINATION:  ABDOMEN:  Soft.  No  palpable flank mass.  Moderate  right flank tenderness as noted.  Bowel sounds active.  Bladder not  palpable.  PELVIC:  Not done.   LABORATORIES:  CBC:  WBC 24, hemoglobin 13.9, hematocrit 40.4.  BUN 9,  creatinine 1.0.  Urinalysis revealed moderate blood, nitrites positive,  leukocytes small, wbc's 7-10 per high powered field, rbc's 11-20 per high  powered field, bacteria many.  Urine culture and sensitivity has revealed  more than 100,000 colonies of E. coli.  The sensitivity pending.  The urine  culture was also positive for gram-negative rods.   IMPRESSION:  Acute right pyelonephritis, urinary tract infection due to  Escherichia coli, urosepsis.   PLAN:  There are no underlying lesions associated with urosepsis and acute  right pyelonephritis.  No further evaluation is indicated at this time.  Suggest continue  IV Rocephin and Levaquin.  Await culture sensitivity  results.  Thanks for this consult.  Will follow the patient with you.                                               Dennie Maizes, M.D.    SK/MEDQ  D:  05/24/2002  T:  05/25/2002  Job:  [id number]

## 2011-01-26 NOTE — Consult Note (Signed)
NAME:  Brianna Sampson, Brianna Sampson                        ACCOUNT NO.:  [id number]   MEDICAL RECORD NO.:  [id number]                   PATIENT TYPE:  INP   LOCATION:  A310                                 FACILITY:  [facility]   PHYSICIAN:  Dirk Dress. Katrinka Blazing, M.D.                DATE OF BIRTH:  [birth year]   DATE OF CONSULTATION:  DATE OF DISCHARGE:                                   CONSULTATION   HISTORY:  A 30 year old female admitted on September 12 with a history of  severe right flank/back pain with fever, chills, nausea and vomiting for  five days' duration.  She noted that her urine was foul.  She was having  some frequency but no urgency.  She was seen in the office and referred to  the emergency room for evaluation.  Ultrasound revealed increased  echogenicity of the upper pole of the kidney on the right and CT scan showed  changes of the right kidney suggestive of acute right pyelonephritis.  The  patient was admitted to the hospital for treatment.  She has been treated  with Levaquin and Rocephin.  Blood cultures grew out gram-negative rods  which turned out to be E. coli.  These are quite sensitive to her  antibiotics.  The patient has gradually defervesced and her white count has  decreased from 24,000 on admission to 8800 today.  Her initial sed rate was  45 but there has not been a followup sed rate.  She has demonstrated mild  hyperglycemia and may be diabetic and this will need to be followed up.  She  has continued to improve clinically with her back pain and flank pain but  she has had recurrent episodes of epigastric and right upper quadrant  discomfort.  Since these symptoms are atypical for pyelonephritis, she had a  gallbladder ultrasound which was negative but a HIDA scan which showed an  ejection fraction of less than 10%, compatible with severe biliary  dyskinesia.  She is responding appropriately to her antibiotic therapy and  has been afebrile all day today for the first time.   She, however, has  nausea and vomiting on a daily basis.  She had nausea and vomiting twice  today.  She has been counseled for the possibility of a cholecystectomy  during this admission but I will discuss this with her later during the  hospitalization.   PAST MEDICAL HISTORY:  She has had no major medical illness except for  situational depression and obesity.  Her only surgery is the C-section.   PRESENT MEDICATIONS:  1. Zoloft 50 mg q.d.  2. Xenical 20 mg t.i.d.  3. Transdermal contraceptives.   ALLERGIES:  She has no known drug allergies.   SOCIAL HISTORY:  She does not use drugs or drink but she does smoke about  one-half pack of cigarettes per day.   FAMILY HISTORY:  Positive for hypertension and diabetes.  PHYSICAL EXAMINATION:  GENERAL:  She is a short, moderately obese female in  no acute distress.  VITAL SIGNS:  Blood pressure 120/86, pulse 72, respirations 20, temperature  98 degrees.  HEENT:  Unremarkable except for tongue piercing.  NECK:  Supple without adenopathy or jugular venous distention.  LUNGS:  Clear to auscultation.  ABDOMEN:  Obese with mild epigastric tenderness.  I cannot detect any  suprapubic tenderness.  BACK:  Minimal CVA tenderness on the right side at this time.  There is no  CVA tenderness on the left.  EXTREMITIES:  No cyanosis, clubbing, or edema.  No joint deformity.  NEUROLOGIC:  Cranial nerves intact, II-XII.  She is alert and fully  oriented.  There is no motor, sensory, or cerebellar deficit.   IMPRESSION:  1. Acute right pyelonephritis with sepsis, improving and resolving.  2. Severe biliary colic due to chronic or subacute cholecystitis.  3. Mild hyperglycemia.  4. Morbid obesity, moderate.   RECOMMENDATIONS:  The patient should be afebrile for at least 48 hours with  a normalized white count before operative therapy.  She has been advised to  have surgery this hospitalization, since it is unlikely that her symptoms  will stay  resolved after she is discharged home.  She wishes to think about  this and I will allow her to discuss this with her family.  If she remains  afebrile and her white count remains normal, it is possible that she could  have surgery on Friday and be discharged on Saturday morning.  I will  discuss this with her later in the week.                                                 Dirk Dress. Katrinka Blazing, M.D.    LCS/MEDQ  D:  05/26/2002  T:  05/27/2002  Job:  [id number]

## 2011-01-26 NOTE — Discharge Summary (Signed)
NAME:  Brianna Sampson, Brianna Sampson                        ACCOUNT NO.:  [id number]   MEDICAL RECORD NO.:  [id number]                   PATIENT TYPE:  INP   LOCATION:  A310                                 FACILITY:  [facility]   PHYSICIAN:  Annia Friendly. Loleta Chance, M.D.                DATE OF BIRTH:  [birth year]   DATE OF ADMISSION:  05/22/2002  DATE OF DISCHARGE:  05/30/2002                                 DISCHARGE SUMMARY   HISTORY OF PRESENT ILLNESS:  The patient was a 30 year old single gravida 5,  para 3, AB2 unemployed black female from [place], [place].  Chief  complaint was lower back pain x4 days.  Her pain was confined to the right  side.  The pain was described as sharp and constant.  Moreover, the patient  noted bad smelling urine x1 day.  She incurred some nausea and vomiting on  the morning of admission.  The patient was seen in the office on the day of  admission by her primary caretaker, Dr. Syliva Overman, on May 19, 2002.  The patient was sent to the emergency room for further tests by Dr.  Lodema Hong.  The patient denied increased frequency of voiding, gross  hematuria, and pain with voiding.  She did admit to experience some chills.  In the emergency room, an ultrasound of the kidneys was done and  demonstrated signs consistent with increased echogenicity involving the  upper pole of the right kidney revealing questionable pyelonephritis.  Moreover, a CT of the abdomen was done to confirm right pyelonephritis  versus early appendicitis.  CT of the abdomen revealed findings consistent  with right pyelonephritis as read by Dr. Ulyses Southward without hydronephrosis  or stone.  Dr. Tyron Russell of radiology observed slight constipation also.   PAST MEDICAL HISTORY:  Negative for hypertension, diabetes, tuberculosis,  cancer, sickle cell, asthma, and seizure disorder.   PRESCRIBED MEDICATION ON ADMISSION:  1. Zoloft 50 mg p.o. every day.  2. Xenical 120 mg p.o. t.i.d. for weight loss.  3. Birth control patch.   ALLERGIES:  The patient was not allergic to any known medication.   HABITS:  Positive for cigarette smoking (currently smoking 10 cigarettes per  day since the age of 66).   FAMILY HISTORY:  Mother living age 15 with history of hypertension,  diabetes; father deceased age 71 secondary to homicide; two sisters living  at age 37 in good health and age 61 in good health; two brothers living at  age 77 in good health and age 51 in good health.   SEXUALLY TRANSMITTED DISEASE HISTORY:  Negative for gonorrhea, syphilis,  herpes, and HIV infection.  Last menstrual period (menstrual cycle started  on May 18, 2002).   HOSPITAL COURSE:  1. ACUTE BACTEREMIA SECONDARY TO PYELONEPHRITIS SECONDARY TO ESCHERICHIA     COLI:  Vitals on admission were as follows:  Temperature 100.6, pulse 89,  respirations 20, blood pressure 108/64.  General appearance is a well-     developed, overweight, medium height, alert black female who appeared not     to feel well but no apparent respiratory distress.  Lungs were clear.     Heart revealed an audible S1 and S2 without murmur.  Rhythm was regular     and rate within normal limits.  Abdomen was obese with  a hypoactive     bowel sound.  Abdominal exam revealed right lower quadrant tenderness     with deep palpation.  Abdominal exam demonstrated on palpable mass or     organomegaly.  Hypogastric area was positive for old, healed horizontal     surgical scar and positive mild suprapubic tenderness.  Right CVA was     positive with tenderness on palpation.  Significant labs on admission     were as follows:  White count 24,000, hemoglobin 13.9, hematocrit 40,400,     platelets 261,000.  Sodium 136, potassium 3.8, chloride 105, CO2 26,     glucose 109, BUN 9, creatinine 1.0, total bilirubin 0.8, alkaline     phosphatase 78, SGOT 13, SGPT 12, total bilirubin 6.7, albumin 2.7,     calcium 8.6, serum amylase 38, serum lipase 14.  An hCG  quantitative less     than 5 MIU/mL.  Urinalysis:  Specific gravity 1.015, pH 5.0, no glucose,     negative for ketones, nitrite positive, 7-10 WBC's, 11-20 RBC's, many     bacteria.  Urine and blood cultures were ordered.  Moreover, the patient     was started on IV antibiotics using IV Rocephin and IV Levaquin.  Urine     culture grew E. coli greater than 100,000 species per mL.  Blood culture     also grew E. coli.  The patient was treated with IV antibiotics for     approximately seven days.  White count showed a decrease to 16,500 on     May 23, 2002, 11,100 on May 24, 2002, and 8800 on May 26, 2002.   1. EPIGASTRIC SECONDARY TO CHRONIC CHOLELITHIASIS:  Despite resolving white     count, the patient continued to experience intermittent nausea and     vomiting with epigastric pain.  Epigastric pain involved the     midepigastric and right upper quadrant areas.  Moreover, she continued to     experience some spiking fever through May 25, 2002.  A     hepatobiliary scan was done, since ultrasound of the gallbladder was     negative, on September 12.  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 __________ on     May 25, 2002.  A surgical consult was done by Dr. Maggie Schwalbe on     May 26, 2002.  Initially, it was felt that the plan was to treat     the bacteremia secondary to E. coli and observe; however, the patient     continued to experience intermittent nausea and vomiting throughout this     hospitalization and the patient underwent elective gallbladder removal on     May 29, 2002 without complication.  Finding on surgical procedure     revealed chronic cholecystitis.  Postoperative course was benign.  The     patient was discharged home on  May 30, 2002.   DISCHARGE INSTRUCTIONS: 1. Diet:  Low fat.   2. Activity:  Increase slowly.   DISCHARGE MEDICATIONS:  1. Cipro 500 mg p.o. b.i.d.  2. Lortab 5 mg p.o. q.6h. p.r.n. for pain.  3. Phenergan 25 mg p.o. q.4h. if needed for nausea.   FOLLOW UP:  Follow up at the office on __________ 9, 2003.   FINAL PRIMARY DIAGNOSIS:  Acute bacteremia secondary to pyelonephritis  secondary to Escherichia coli.   SECONDARY DIAGNOSES:  1. Epigastric pain secondary to chronic cholecystitis.  2. Obesity.                                               Annia Friendly. Loleta Chance, M.D.    Levonne Hubert  D:  06/01/2002  T:  06/02/2002  Job:  [id number]

## 2012-11-03 ENCOUNTER — Telehealth: Payer: Self-pay | Admitting: Family Medicine

## 2012-11-03 NOTE — Telephone Encounter (Signed)
No availability before June or July for new pts, pls let her know, also check if living in [place] still, if so that will be a problem with medicaid

## 2012-11-12 NOTE — Telephone Encounter (Signed)
Patient has [facility] and she will have to find a Doctor in that county patient is aware

## 2013-07-31 ENCOUNTER — Encounter (HOSPITAL_COMMUNITY): Payer: Self-pay | Admitting: Emergency Medicine

## 2013-07-31 ENCOUNTER — Emergency Department (HOSPITAL_COMMUNITY): Payer: Medicaid Other

## 2013-07-31 ENCOUNTER — Emergency Department (HOSPITAL_COMMUNITY)
Admission: EM | Admit: 2013-07-31 | Discharge: 2013-07-31 | Disposition: A | Discharge status: Home / Self Care | Payer: Medicaid Other | Attending: Emergency Medicine | Admitting: Emergency Medicine

## 2013-07-31 DIAGNOSIS — Y93E9 Activity, other interior property and clothing maintenance: Secondary | ICD-10-CM | POA: Insufficient documentation

## 2013-07-31 DIAGNOSIS — F17200 Nicotine dependence, unspecified, uncomplicated: Secondary | ICD-10-CM | POA: Insufficient documentation

## 2013-07-31 DIAGNOSIS — F3289 Other specified depressive episodes: Secondary | ICD-10-CM | POA: Insufficient documentation

## 2013-07-31 DIAGNOSIS — F329 Major depressive disorder, single episode, unspecified: Secondary | ICD-10-CM | POA: Insufficient documentation

## 2013-07-31 DIAGNOSIS — Z79899 Other long term (current) drug therapy: Secondary | ICD-10-CM | POA: Insufficient documentation

## 2013-07-31 DIAGNOSIS — W010XXA Fall on same level from slipping, tripping and stumbling without subsequent striking against object, initial encounter: Secondary | ICD-10-CM | POA: Insufficient documentation

## 2013-07-31 DIAGNOSIS — S0101XA Laceration without foreign body of scalp, initial encounter: Secondary | ICD-10-CM

## 2013-07-31 DIAGNOSIS — R51 Headache: Secondary | ICD-10-CM | POA: Insufficient documentation

## 2013-07-31 DIAGNOSIS — Y92009 Unspecified place in unspecified non-institutional (private) residence as the place of occurrence of the external cause: Secondary | ICD-10-CM | POA: Insufficient documentation

## 2013-07-31 DIAGNOSIS — F411 Generalized anxiety disorder: Secondary | ICD-10-CM | POA: Insufficient documentation

## 2013-07-31 DIAGNOSIS — S0100XA Unspecified open wound of scalp, initial encounter: Secondary | ICD-10-CM | POA: Insufficient documentation

## 2013-07-31 DIAGNOSIS — W19XXXA Unspecified fall, initial encounter: Secondary | ICD-10-CM

## 2013-07-31 HISTORY — DX: Major depressive disorder, single episode, unspecified: F32.9

## 2013-07-31 HISTORY — DX: Anxiety disorder, unspecified: F41.9

## 2013-07-31 HISTORY — DX: Depression, unspecified: F32.A

## 2013-07-31 MED ORDER — ONDANSETRON 4 MG PO TBDP
4.0000 mg | ORAL_TABLET | Freq: Once | ORAL | Status: AC
  Administered 2013-07-31: 4 mg via ORAL
  Filled 2013-07-31: qty 1

## 2013-07-31 MED ORDER — OXYCODONE-ACETAMINOPHEN 5-325 MG PO TABS
2.0000 | ORAL_TABLET | Freq: Once | ORAL | Status: AC
  Administered 2013-07-31: 2 via ORAL
  Filled 2013-07-31: qty 2

## 2013-07-31 NOTE — ED Notes (Signed)
MD at Bedside stapling head laceration.

## 2013-07-31 NOTE — ED Provider Notes (Signed)
CSN: [id number]     Arrival date & time 07/31/13  1124 History   First MD Initiated Contact with Patient 07/31/13 1137     Chief Complaint  Patient presents with  . Head Laceration   (Consider location/radiation/quality/duration/timing/severity/associated sxs/prior Treatment) HPI Comments: Patient is a 32 year old female who presents to the ED after a mechanical fall that occurred prior to arrival. Patient reports cleaning her bath tub and when she was leaving the bathroom, she slipped and hit the back of her head on the metal shower door. The fall was mechanical. Patient reports LOC for an unknown amount of time. She reports a laceration on the back of her head that she noticed after seeing blood when she woke up. Patient reports associated headache now. No aggravating/alleviating factors. Patient denies any other injury.   Patient is a 32 y.o. female presenting with scalp laceration.  Head Laceration Associated symptoms include headaches.    Past Medical History  Diagnosis Date  . Anxiety   . Depression    Past Surgical History  Procedure Laterality Date  . Cholecystectomy  2002   No family history on file. History  Substance Use Topics  . Smoking status: Current Every Day Smoker -- 0.50 packs/day    Types: Cigarettes  . Smokeless tobacco: Not on file  . Alcohol Use: No   OB History   Grav Para Term Preterm Abortions TAB SAB Ect Mult Living                 Review of Systems  Skin: Positive for wound.  Neurological: Positive for headaches.  All other systems reviewed and are negative.    Allergies  Review of patient's allergies indicates no known allergies.  Home Medications   Current Outpatient Rx  Name  Route  Sig  Dispense  Refill  . lithium carbonate 150 MG capsule   Oral   Take 150 mg by mouth 2 (two) times daily with a meal.          BP 100/85  Pulse 120  Temp(Src) 98.7 F (37.1 C) (Oral)  Resp 16  Ht 5' (1.524 m)  Wt 185 lb (83.915 kg)  BMI  36.13 kg/m2  SpO2 100%  LMP 07/15/2013 Physical Exam  Nursing note and vitals reviewed. Constitutional: She is oriented to person, place, and time. She appears well-developed and well-nourished. No distress.  HENT:  Head: Normocephalic and atraumatic.  3 cm deep laceration of central occipital area. Bleeding controlled at this time.   Eyes: Conjunctivae and EOM are normal. Pupils are equal, round, and reactive to light.  Neck: Normal range of motion.  Cardiovascular: Normal rate and regular rhythm.  Exam reveals no gallop and no friction rub.   No murmur heard. Pulmonary/Chest: Effort normal and breath sounds normal. She has no wheezes. She has no rales. She exhibits no tenderness.  Abdominal: Soft. She exhibits no distension. There is no tenderness. There is no rebound and no guarding.  Musculoskeletal: Normal range of motion.  Neurological: She is alert and oriented to person, place, and time. Coordination normal.  Extremity strength and sensation equal and intact bilaterally. Speech is goal-oriented. Moves limbs without ataxia.   Skin: Skin is warm and dry.  See HENT.   Psychiatric: She has a normal mood and affect. Her behavior is normal.    ED Course  Procedures (including critical care time)  LACERATION REPAIR Performed by: Emilia Beck Authorized by: Emilia Beck Consent: Verbal consent obtained. Risks and benefits: risks,  benefits and alternatives were discussed Consent given by: patient Patient identity confirmed: provided demographic data Prepped and Draped in normal sterile fashion Wound explored  Laceration Location: occipital region of scalp  Laceration Length: 3 cm  No Foreign Bodies seen or palpated  Anesthesia: local infiltration  Local anesthetic: lidocaine 2% with epinephrine  Anesthetic total: 2 ml  Irrigation method: syringe Amount of cleaning: standard  Skin closure: staple  Number of staples: 3  Technique: n/a  Patient  tolerance: Patient tolerated the procedure well with no immediate complications.   Labs Review Labs Reviewed - No data to display Imaging Review Ct Head Wo Contrast  07/31/2013   CLINICAL DATA:  Fall.  Head laceration.  EXAM: CT HEAD WITHOUT CONTRAST  TECHNIQUE: Contiguous axial images were obtained from the base of the skull through the vertex without intravenous contrast.  COMPARISON:  MRI 03/22/2004  FINDINGS: Soft tissue swelling noted posteriorly. No underlying calvarial abnormality. No acute intracranial abnormality. Specifically, no hemorrhage, hydrocephalus, mass lesion, acute infarction, or significant intracranial injury. No acute calvarial abnormality. Visualized paranasal sinuses and mastoids clear. Orbital soft tissues unremarkable.  IMPRESSION: No acute intracranial abnormality.   Electronically Signed   By: Charlett Nose M.D.   On: 07/31/2013 13:47    EKG Interpretation   None       MDM   1. Fall, initial encounter   2. Scalp laceration, initial encounter     12:23 PM Head CT pending. Patient will have percocet and zofran for pain. No neuro deficits noted. Patient is tachycardic with other vitals stable.   1:57 PM Head CT unremarkable. Patient's head laceration repaired without difficulty. Patient will be discharged without further evaluation. Patient instructed to return in 5 days for staple removal.   Emilia Beck, PA-C 07/31/13 1401

## 2013-07-31 NOTE — ED Notes (Signed)
Patient transported to CT 

## 2013-07-31 NOTE — ED Notes (Signed)
Pt reports LOC with fall. PA at bedside for assessment.

## 2013-07-31 NOTE — ED Notes (Signed)
Pt slipped on bathroom floor and hit back of head on metal shower door.  Pt is anxious.  1 inch lac to back of head.  HR 150's and pt feels dizzy.  Denies abuse.

## 2013-08-08 NOTE — ED Provider Notes (Signed)
Medical screening examination/treatment/procedure(s) were performed by non-physician practitioner and as supervising physician I was immediately available for consultation/collaboration.    Daevon Holdren L Chasady Longwell, MD 08/08/13 0713 

## 2013-08-22 ENCOUNTER — Emergency Department (HOSPITAL_COMMUNITY)
Admission: EM | Admit: 2013-08-22 | Discharge: 2013-08-22 | Disposition: A | Discharge status: Home / Self Care | Payer: Medicaid Other | Attending: Emergency Medicine | Admitting: Emergency Medicine

## 2013-08-22 ENCOUNTER — Encounter (HOSPITAL_COMMUNITY): Payer: Self-pay | Admitting: Emergency Medicine

## 2013-08-22 DIAGNOSIS — F3289 Other specified depressive episodes: Secondary | ICD-10-CM | POA: Insufficient documentation

## 2013-08-22 DIAGNOSIS — Z4802 Encounter for removal of sutures: Secondary | ICD-10-CM | POA: Insufficient documentation

## 2013-08-22 DIAGNOSIS — Z79899 Other long term (current) drug therapy: Secondary | ICD-10-CM | POA: Insufficient documentation

## 2013-08-22 DIAGNOSIS — F329 Major depressive disorder, single episode, unspecified: Secondary | ICD-10-CM | POA: Insufficient documentation

## 2013-08-22 DIAGNOSIS — F17200 Nicotine dependence, unspecified, uncomplicated: Secondary | ICD-10-CM | POA: Insufficient documentation

## 2013-08-22 NOTE — ED Provider Notes (Signed)
CSN: [id number]     Arrival date & time 08/22/13  2107 History   This chart was scribed for non-physician practitioner, Renne Crigler PA-C working with Flint Melter, MD, by Andrew Au, ED Scribe. This patient was seen in room TR05C/TR05C and the patient's care was started at 9:30 PM  Chief Complaint  Patient presents with  . Suture / Staple Removal    The history is provided by the patient. No language interpreter was used.   HPI Comments: Brianna Sampson is a 32 y.o. female who presents to the Emergency Department for removal of sutures to occipital scalp that have been in place since Thanksgiving. She received 3 sutures after she slipped and fell in the bath tub. She reports soreness to the area but denies drainage or fever.   Past Medical History  Diagnosis Date  . Anxiety   . Depression    Past Surgical History  Procedure Laterality Date  . Cholecystectomy  2002   No family history on file. History  Substance Use Topics  . Smoking status: Current Every Day Smoker -- 0.50 packs/day    Types: Cigarettes  . Smokeless tobacco: Not on file  . Alcohol Use: No   OB History   Grav Para Term Preterm Abortions TAB SAB Ect Mult Living                 Review of Systems  Constitutional: Negative for fever.  Skin: Positive for wound.    Allergies  Review of patient's allergies indicates no known allergies.  Home Medications   Current Outpatient Rx  Name  Route  Sig  Dispense  Refill  . ibuprofen (ADVIL,MOTRIN) 200 MG tablet   Oral   Take 400 mg by mouth daily as needed for headache.         . lithium carbonate 150 MG capsule   Oral   Take 150 mg by mouth 2 (two) times daily with a meal.          Triage Vitals BP 126/72  Pulse 101  Temp(Src) 97.9 F (36.6 C) (Oral)  Resp 18  SpO2 100%  LMP 07/10/2013  Physical Exam  Nursing note and vitals reviewed. Constitutional: She appears well-developed and well-nourished. No distress.  HENT:  Head: Normocephalic  and atraumatic.  Wound is healed. 3 staples in place.   Eyes: EOM are normal.  Neck: Neck supple. No tracheal deviation present.  Cardiovascular: Normal rate.   Pulmonary/Chest: Effort normal. No respiratory distress.  Musculoskeletal: Normal range of motion.  Neurological: She is alert.  Skin: Skin is warm and dry.  Psychiatric: She has a normal mood and affect. Her behavior is normal.    ED Course  Procedures  DIAGNOSTIC STUDIES: Oxygen Saturation is 100% on RA, normal by my interpretation.    COORDINATION OF CARE: 9:50 PM- Pt advised of plan for treatment and pt agrees.  Labs Review Labs Reviewed - No data to display Imaging Review No results found.  EKG Interpretation   None      Vital signs reviewed and are as follows: Filed Vitals:   08/22/13 2117  BP: 126/72  Pulse: 101  Temp: 97.9 F (36.6 C)  Resp: 18   3 staples removed from scalp without complication.   MDM   1. Encounter for staple removal    Patient with staples removed from well-healed wound.   I personally performed the services described in this documentation, which was scribed in my presence. The recorded information has been  reviewed and is accurate.    Renne Crigler, PA-C 08/22/13 2326

## 2013-08-22 NOTE — ED Notes (Signed)
Pt returned for removal of staples in back of head.  Area clean and dry.

## 2013-08-22 NOTE — ED Notes (Signed)
The pt has had staples in her head since thabksgiving.  She has tried to get them removed but was told she had to return here to get the removed.  Some soreness still and she has a headache

## 2013-08-23 NOTE — ED Provider Notes (Signed)
Medical screening examination/treatment/procedure(s) were performed by non-physician practitioner and as supervising physician I was immediately available for consultation/collaboration.  Benaiah Behan L Gelsey Amyx, MD 08/23/13 0044 

## 2014-09-13 ENCOUNTER — Inpatient Hospital Stay (HOSPITAL_COMMUNITY)
Admission: AD | Admit: 2014-09-13 | Discharge: 2014-09-14 | Disposition: A | Discharge status: Home / Self Care | Payer: Medicaid Other | Source: Ambulatory Visit | Attending: Family Medicine | Admitting: Family Medicine

## 2014-09-13 ENCOUNTER — Inpatient Hospital Stay (HOSPITAL_COMMUNITY): Payer: Medicaid Other

## 2014-09-13 ENCOUNTER — Encounter (HOSPITAL_COMMUNITY): Payer: Self-pay | Admitting: *Deleted

## 2014-09-13 DIAGNOSIS — O209 Hemorrhage in early pregnancy, unspecified: Secondary | ICD-10-CM | POA: Diagnosis not present

## 2014-09-13 DIAGNOSIS — Z3A01 Less than 8 weeks gestation of pregnancy: Secondary | ICD-10-CM | POA: Diagnosis not present

## 2014-09-13 DIAGNOSIS — R102 Pelvic and perineal pain: Secondary | ICD-10-CM | POA: Insufficient documentation

## 2014-09-13 DIAGNOSIS — F329 Major depressive disorder, single episode, unspecified: Secondary | ICD-10-CM | POA: Diagnosis not present

## 2014-09-13 DIAGNOSIS — O26891 Other specified pregnancy related conditions, first trimester: Secondary | ICD-10-CM

## 2014-09-13 DIAGNOSIS — Z9049 Acquired absence of other specified parts of digestive tract: Secondary | ICD-10-CM | POA: Insufficient documentation

## 2014-09-13 DIAGNOSIS — F17210 Nicotine dependence, cigarettes, uncomplicated: Secondary | ICD-10-CM | POA: Insufficient documentation

## 2014-09-13 LAB — CBC
HCT: 35.4 % — ABNORMAL LOW (ref 36.0–46.0)
Hemoglobin: 11.9 g/dL — ABNORMAL LOW (ref 12.0–15.0)
MCH: 30.7 pg (ref 26.0–34.0)
MCHC: 33.6 g/dL (ref 30.0–36.0)
MCV: 91.5 fL (ref 78.0–100.0)
PLATELETS: 308 10*3/uL (ref 150–400)
RBC: 3.87 MIL/uL (ref 3.87–5.11)
RDW: 15.1 % (ref 11.5–15.5)
WBC: 10.4 10*3/uL (ref 4.0–10.5)

## 2014-09-13 LAB — WET PREP, GENITAL
CLUE CELLS WET PREP: NONE SEEN
TRICH WET PREP: NONE SEEN
Yeast Wet Prep HPF POC: NONE SEEN

## 2014-09-13 LAB — HCG, QUANTITATIVE, PREGNANCY: hCG, Beta Chain, Quant, S: 44 m[IU]/mL — ABNORMAL HIGH (ref <5)

## 2014-09-13 LAB — POCT PREGNANCY, URINE: Preg Test, Ur: POSITIVE — AB

## 2014-09-13 NOTE — MAU Note (Signed)
Pt reports spotting 2 days ago, tonight had start of heavier bleeding. Positive home preg test one week.

## 2014-09-13 NOTE — MAU Provider Note (Signed)
History     CSN: [id number]  Arrival date and time: 09/13/14 2132   First Provider Initiated Contact with Patient 09/13/14 2320      Chief Complaint  Patient presents with  . Possible Pregnancy  . Vaginal Bleeding  . Abdominal Pain   HPI  Brianna Sampson is a 34 y.o. at [redacted]w[redacted]d who presents today with cramping and bleeding. She states that she had a +UPT about 10 days ago, and has had cramping off and on since then. However, yesterday the cramping became worse, and she had bleeding like a period. She has not started prenatal care at this time.   Past Medical History  Diagnosis Date  . Anxiety   . Depression     Past Surgical History  Procedure Laterality Date  . Cholecystectomy  2002    History reviewed. No pertinent family history.  History  Substance Use Topics  . Smoking status: Current Every Day Smoker -- 0.50 packs/day    Types: Cigarettes  . Smokeless tobacco: Not on file  . Alcohol Use: No    Allergies: No Known Allergies  Prescriptions prior to admission  Medication Sig Dispense Refill Last Dose  . ibuprofen (ADVIL,MOTRIN) 200 MG tablet Take 400 mg by mouth daily as needed for headache.   More than a month at Unknown time  . lithium carbonate 150 MG capsule Take 150 mg by mouth 2 (two) times daily with a meal.   More than a month at Unknown time    ROS Physical Exam   Blood pressure 145/88, pulse 85, temperature 98.1 F (36.7 C), temperature source Oral, resp. rate 16, height 4\' 11"  (1.499 m), weight 86.637 kg (191 lb), last menstrual period 08/05/2014, SpO2 100 %.  Physical Exam  Nursing note and vitals reviewed. Constitutional: She is oriented to person, place, and time. She appears well-developed and well-nourished. No distress.  Cardiovascular: Normal rate.   Respiratory: Effort normal.  GI: Soft. There is no tenderness. There is no rebound.  Genitourinary:   External: no lesion Vagina: small amount of blood seen  Cervix: pink, smooth, no  CMT Uterus: NSSC Adnexa: NT   Neurological: She is alert and oriented to person, place, and time.  Skin: Skin is warm and dry.  Psychiatric: She has a normal mood and affect.    MAU Course  Procedures  Results for orders placed or performed during the hospital encounter of 09/13/14 (from the past 24 hour(s))  Pregnancy, urine POC     Status: Abnormal   Collection Time: 09/13/14 10:27 PM  Result Value Ref Range   Preg Test, Ur POSITIVE (A) NEGATIVE  CBC     Status: Abnormal   Collection Time: 09/13/14 10:58 PM  Result Value Ref Range   WBC 10.4 4.0 - 10.5 K/uL   RBC 3.87 3.87 - 5.11 MIL/uL   Hemoglobin 11.9 (L) 12.0 - 15.0 g/dL   HCT 35.4 (L) 36.0 - 46.0 %   MCV 91.5 78.0 - 100.0 fL   MCH 30.7 26.0 - 34.0 pg   MCHC 33.6 30.0 - 36.0 g/dL   RDW 15.1 11.5 - 15.5 %   Platelets 308 150 - 400 K/uL  hCG, quantitative, pregnancy     Status: Abnormal   Collection Time: 09/13/14 10:58 PM  Result Value Ref Range   hCG, Beta Chain, Quant, S 44 (H) <5 mIU/mL  Wet prep, genital     Status: Abnormal   Collection Time: 09/13/14 11:21 PM  Result Value Ref Range  Yeast Wet Prep HPF POC NONE SEEN NONE SEEN   Trich, Wet Prep NONE SEEN NONE SEEN   Clue Cells Wet Prep HPF POC NONE SEEN NONE SEEN   WBC, Wet Prep HPF POC FEW (A) NONE SEEN   US Ob Comp Less 14 Wks  09/14/2014   CLINICAL DATA:  Spotting and cramping. Quantitative beta HCG is 44. LMP was 08/05/2014.  EXAM: OBSTETRIC <14 WK [place] AND TRANSVAGINAL OB US  TECHNIQUE: Both transabdominal and transvaginal ultrasound examinations were performed for complete evaluation of the gestation as well as the maternal uterus, adnexal regions, and pelvic cul-de-sac. Transvaginal technique was performed to assess early pregnancy.  COMPARISON:  None.  FINDINGS: Intrauterine gestational sac: No intrauterine gestational sac identified.  Yolk sac:  Not identified.  Embryo:  Not identified.  Cardiac Activity: Not identified.  Maternal uterus/adnexae: The uterus  is anteverted. Small anterior hypoechoic lesion measuring 6 mm maximal diameter consistent with a small fibroid. No other focal lesions identified in the uterus. Endometrial stripe is not thickened. No endometrial fluid collections are demonstrated. Both ovaries are visualized and appear normal. No abnormal adnexal masses are demonstrated. No free pelvic fluid.  IMPRESSION: No intrauterine gestational sac, yolk sac, or fetal pole identified. Differential considerations include intrauterine pregnancy too early to be sonographically visualized, missed abortion, or ectopic pregnancy. Followup ultrasound is recommended in 10-14 days for further evaluation.   Electronically Signed   By: Lucienne Capers M.D.   On: 09/14/2014 00:26   US Ob Transvaginal  09/14/2014   CLINICAL DATA:  Spotting and cramping. Quantitative beta HCG is 44. LMP was 08/05/2014.  EXAM: OBSTETRIC <14 WK [place] AND TRANSVAGINAL OB US  TECHNIQUE: Both transabdominal and transvaginal ultrasound examinations were performed for complete evaluation of the gestation as well as the maternal uterus, adnexal regions, and pelvic cul-de-sac. Transvaginal technique was performed to assess early pregnancy.  COMPARISON:  None.  FINDINGS: Intrauterine gestational sac: No intrauterine gestational sac identified.  Yolk sac:  Not identified.  Embryo:  Not identified.  Cardiac Activity: Not identified.  Maternal uterus/adnexae: The uterus is anteverted. Small anterior hypoechoic lesion measuring 6 mm maximal diameter consistent with a small fibroid. No other focal lesions identified in the uterus. Endometrial stripe is not thickened. No endometrial fluid collections are demonstrated. Both ovaries are visualized and appear normal. No abnormal adnexal masses are demonstrated. No free pelvic fluid.  IMPRESSION: No intrauterine gestational sac, yolk sac, or fetal pole identified. Differential considerations include intrauterine pregnancy too early to be sonographically  visualized, missed abortion, or ectopic pregnancy. Followup ultrasound is recommended in 10-14 days for further evaluation.   Electronically Signed   By: Lucienne Capers M.D.   On: 09/14/2014 00:26     Assessment and Plan   1. Pelvic pain affecting pregnancy in first trimester, antepartum    Ectopic precautions  Return to MAU as needed FU in 48 hours for repeat HCG  Follow-up Information    Follow up with [facility] In 2 days.   Contact information:   [address] [id number] [facility]c [place] [phone]       Mathis Bud 09/13/2014, 11:26 PM

## 2014-09-13 NOTE — MAU Note (Signed)
PT SAYS LMP-   11-26-    THEN 09-11-2014-  STARTED SPOTTING.    THEN   TODAY  AT 2 PM - BECAME  HEAVIER- THEN  AT 6PM  SHE STARTED  CRAMPING-   THEN AT 7 PM-  HEAVIER.     DR Glo Herring  DEL LAST BABY-  4 YEARS  AGO-   HAD PAP  SMEAR  AT [facility]  IN [place]   LAST  YEAR.   LAST SEX-   Friday.     NO BIRTH CONTROL

## 2014-09-14 DIAGNOSIS — O26891 Other specified pregnancy related conditions, first trimester: Secondary | ICD-10-CM

## 2014-09-14 DIAGNOSIS — R102 Pelvic and perineal pain: Secondary | ICD-10-CM

## 2014-09-14 LAB — HIV ANTIBODY (ROUTINE TESTING W REFLEX): HIV 1&2 Ab, 4th Generation: NONREACTIVE

## 2014-09-14 NOTE — Discharge Instructions (Signed)
°Ectopic Pregnancy °An ectopic pregnancy is when the fertilized egg attaches (implants) outside the uterus. Most ectopic pregnancies occur in the fallopian tube. Rarely do ectopic pregnancies occur on the ovary, intestine, pelvis, or cervix. In an ectopic pregnancy, the fertilized egg does not have the ability to develop into a normal, healthy baby.  °A ruptured ectopic pregnancy is one in which the fallopian tube gets torn or bursts and results in internal bleeding. Often there is intense abdominal pain, and sometimes, vaginal bleeding. Having an ectopic pregnancy can be life threatening. If left untreated, this dangerous condition can lead to a blood transfusion, abdominal surgery, or even death. °CAUSES  °Damage to the fallopian tubes is the suspected cause in most ectopic pregnancies.  °RISK FACTORS °Depending on your circumstances, the risk of having an ectopic pregnancy will vary. The level of risk can be divided into three categories. °High Risk °· You have gone through infertility treatment. °· You have had a previous ectopic pregnancy. °· You have had previous tubal surgery. °· You have had previous surgery to have the fallopian tubes tied (tubal ligation). °· You have tubal problems or diseases. °· You have been exposed to DES. DES is a medicine that was used until 1971 and had effects on babies whose mothers took the medicine. °· You become pregnant while using an intrauterine device (IUD) for birth control.  °Moderate Risk °· You have a history of infertility. °· You have a history of a sexually transmitted infection (STI). °· You have a history of pelvic inflammatory disease (PID). °· You have scarring from endometriosis. °· You have multiple sexual partners. °· You smoke.  °Low Risk °· You have had previous pelvic surgery. °· You use vaginal douching. °· You became sexually active before 36 years of age. °SIGNS AND SYMPTOMS  °An ectopic pregnancy should be suspected in anyone who has missed a period  and has abdominal pain or bleeding. °· You may experience normal pregnancy symptoms, such as: °¨ Nausea. °¨ Tiredness. °¨ Breast tenderness. °· Other symptoms may include: °¨ Pain with intercourse. °¨ Irregular vaginal bleeding or spotting. °¨ Cramping or pain on one side or in the lower abdomen. °¨ Fast heartbeat. °¨ Passing out while having a bowel movement. °· Symptoms of a ruptured ectopic pregnancy and internal bleeding may include: °¨ Sudden, severe pain in the abdomen and pelvis. °¨ Dizziness or fainting. °¨ Pain in the shoulder area. °DIAGNOSIS  °Tests that may be performed include: °· A pregnancy test. °· An ultrasound test. °· Testing the specific level of pregnancy hormone in the bloodstream. °· Taking a sample of uterus tissue (dilation and curettage, D&C). °· Surgery to perform a visual exam of the inside of the abdomen using a thin, lighted tube with a tiny camera on the end (laparoscope). °TREATMENT  °An injection of a medicine called methotrexate may be given. This medicine causes the pregnancy tissue to be absorbed. It is given if: °· The diagnosis is made early. °· The fallopian tube has not ruptured. °· You are considered to be a good candidate for the medicine. °Usually, pregnancy hormone blood levels are checked after methotrexate treatment. This is to be sure the medicine is effective. It may take 4-6 weeks for the pregnancy to be absorbed (though most pregnancies will be absorbed by 3 weeks). °Surgical treatment may be needed. A laparoscope may be used to remove the pregnancy tissue. If severe internal bleeding occurs, a cut (incision) may be made in the lower abdomen (laparotomy), and the ectopic   pregnancy is removed. This stops the bleeding. Part of the fallopian tube, or the whole tube, may be removed as well (salpingectomy). After surgery, pregnancy hormone tests may be done to be sure there is no pregnancy tissue left. You may receive a Rho (D) immune globulin shot if you are Rh negative  and the father is Rh positive, or if you do not know the Rh type of the father. This is to prevent problems with any future pregnancy. °SEEK IMMEDIATE MEDICAL CARE IF:  °You have any symptoms of an ectopic pregnancy. This is a medical emergency. °MAKE SURE YOU: °· Understand these instructions. °· Will watch your condition. °· Will get help right away if you are not doing well or get worse. °Document Released: 10/04/2004 Document Revised: 01/11/2014 Document Reviewed: 03/26/2013 °ExitCare® Patient Information ©2015 [employer]. This information is not intended to replace advice given to you by your health care provider. Make sure you discuss any questions you have with your health care provider. ° ° °

## 2014-09-15 LAB — GC/CHLAMYDIA PROBE AMP
CT Probe RNA: NEGATIVE
GC Probe RNA: NEGATIVE

## 2015-07-19 ENCOUNTER — Encounter (HOSPITAL_COMMUNITY): Payer: Self-pay | Admitting: *Deleted

## 2016-09-10 NOTE — L&D Delivery Note (Signed)
Patient is 36 y.o. [id number] [redacted]w[redacted]d admitted for SOL. AROM at 0230.  Prenatal course also complicated by AMA, prior c-section.  Delivery Note At 3:03 AM a viable female was delivered via VBAC, Spontaneous (Presentation: OA).  APGAR: 7, 8; weight pending.   Placenta status: Intact.  Cord: 3V with the following complications: None.  Cord pH: N/A  Anesthesia: Epidural  Episiotomy: None Lacerations: None Est. Blood Loss (mL): 300  Mom to postpartum.  Baby to Couplet care / Skin to Skin.  Luiz Blare, DO OB Fellow 06/05/2017, 3:41 AM

## 2016-10-22 ENCOUNTER — Encounter (HOSPITAL_COMMUNITY): Payer: Self-pay | Admitting: *Deleted

## 2016-10-22 ENCOUNTER — Inpatient Hospital Stay (HOSPITAL_COMMUNITY)
Admission: AD | Admit: 2016-10-22 | Discharge: 2016-10-22 | Disposition: A | Discharge status: Home / Self Care | Payer: Medicaid Other | Source: Ambulatory Visit | Attending: Obstetrics and Gynecology | Admitting: Obstetrics and Gynecology

## 2016-10-22 ENCOUNTER — Inpatient Hospital Stay (HOSPITAL_COMMUNITY): Payer: Medicaid Other

## 2016-10-22 DIAGNOSIS — O26899 Other specified pregnancy related conditions, unspecified trimester: Secondary | ICD-10-CM

## 2016-10-22 DIAGNOSIS — R109 Unspecified abdominal pain: Secondary | ICD-10-CM | POA: Diagnosis not present

## 2016-10-22 DIAGNOSIS — Z3A01 Less than 8 weeks gestation of pregnancy: Secondary | ICD-10-CM | POA: Diagnosis not present

## 2016-10-22 DIAGNOSIS — R110 Nausea: Secondary | ICD-10-CM | POA: Insufficient documentation

## 2016-10-22 DIAGNOSIS — K5900 Constipation, unspecified: Secondary | ICD-10-CM | POA: Diagnosis not present

## 2016-10-22 DIAGNOSIS — F329 Major depressive disorder, single episode, unspecified: Secondary | ICD-10-CM | POA: Insufficient documentation

## 2016-10-22 DIAGNOSIS — Z3A08 8 weeks gestation of pregnancy: Secondary | ICD-10-CM | POA: Insufficient documentation

## 2016-10-22 DIAGNOSIS — Z9049 Acquired absence of other specified parts of digestive tract: Secondary | ICD-10-CM | POA: Insufficient documentation

## 2016-10-22 DIAGNOSIS — O99341 Other mental disorders complicating pregnancy, first trimester: Secondary | ICD-10-CM | POA: Diagnosis not present

## 2016-10-22 DIAGNOSIS — O418X10 Other specified disorders of amniotic fluid and membranes, first trimester, not applicable or unspecified: Secondary | ICD-10-CM

## 2016-10-22 DIAGNOSIS — Z79899 Other long term (current) drug therapy: Secondary | ICD-10-CM | POA: Insufficient documentation

## 2016-10-22 DIAGNOSIS — Z3491 Encounter for supervision of normal pregnancy, unspecified, first trimester: Secondary | ICD-10-CM

## 2016-10-22 DIAGNOSIS — O99331 Smoking (tobacco) complicating pregnancy, first trimester: Secondary | ICD-10-CM | POA: Diagnosis not present

## 2016-10-22 DIAGNOSIS — O468X1 Other antepartum hemorrhage, first trimester: Secondary | ICD-10-CM

## 2016-10-22 DIAGNOSIS — O219 Vomiting of pregnancy, unspecified: Secondary | ICD-10-CM

## 2016-10-22 DIAGNOSIS — O99611 Diseases of the digestive system complicating pregnancy, first trimester: Secondary | ICD-10-CM | POA: Diagnosis not present

## 2016-10-22 DIAGNOSIS — O26891 Other specified pregnancy related conditions, first trimester: Secondary | ICD-10-CM | POA: Insufficient documentation

## 2016-10-22 DIAGNOSIS — F17210 Nicotine dependence, cigarettes, uncomplicated: Secondary | ICD-10-CM | POA: Diagnosis not present

## 2016-10-22 DIAGNOSIS — F419 Anxiety disorder, unspecified: Secondary | ICD-10-CM | POA: Insufficient documentation

## 2016-10-22 DIAGNOSIS — O208 Other hemorrhage in early pregnancy: Secondary | ICD-10-CM | POA: Insufficient documentation

## 2016-10-22 LAB — COMPREHENSIVE METABOLIC PANEL
ALBUMIN: 4 g/dL (ref 3.5–5.0)
ALT: 10 U/L — AB (ref 14–54)
AST: 13 U/L — ABNORMAL LOW (ref 15–41)
Alkaline Phosphatase: 66 U/L (ref 38–126)
Anion gap: 8 (ref 5–15)
BILIRUBIN TOTAL: 0.3 mg/dL (ref 0.3–1.2)
BUN: 10 mg/dL (ref 6–20)
CALCIUM: 9.3 mg/dL (ref 8.9–10.3)
CO2: 25 mmol/L (ref 22–32)
CREATININE: 0.65 mg/dL (ref 0.44–1.00)
Chloride: 106 mmol/L (ref 101–111)
GFR calc Af Amer: >60 mL/min (ref >60)
GFR calc non Af Amer: >60 mL/min (ref >60)
GLUCOSE: 100 mg/dL — AB (ref 65–99)
Potassium: 3.9 mmol/L (ref 3.5–5.1)
Sodium: 139 mmol/L (ref 135–145)
TOTAL PROTEIN: 7.3 g/dL (ref 6.5–8.1)

## 2016-10-22 LAB — LIPASE, BLOOD: Lipase: 20 U/L (ref 11–51)

## 2016-10-22 LAB — URINALYSIS, ROUTINE W REFLEX MICROSCOPIC
Bilirubin Urine: NEGATIVE
Glucose, UA: NEGATIVE mg/dL
Hgb urine dipstick: NEGATIVE
KETONES UR: NEGATIVE mg/dL
LEUKOCYTES UA: NEGATIVE
NITRITE: NEGATIVE
PROTEIN: NEGATIVE mg/dL
Specific Gravity, Urine: 1.024 (ref 1.005–1.030)
pH: 5 (ref 5.0–8.0)

## 2016-10-22 LAB — CBC
HCT: 35.4 % — ABNORMAL LOW (ref 36.0–46.0)
Hemoglobin: 11.8 g/dL — ABNORMAL LOW (ref 12.0–15.0)
MCH: 28.6 pg (ref 26.0–34.0)
MCHC: 33.3 g/dL (ref 30.0–36.0)
MCV: 85.7 fL (ref 78.0–100.0)
PLATELETS: 314 10*3/uL (ref 150–400)
RBC: 4.13 MIL/uL (ref 3.87–5.11)
RDW: 16.9 % — ABNORMAL HIGH (ref 11.5–15.5)
WBC: 10.1 10*3/uL (ref 4.0–10.5)

## 2016-10-22 LAB — POCT PREGNANCY, URINE: Preg Test, Ur: POSITIVE — AB

## 2016-10-22 LAB — WET PREP, GENITAL
Sperm: NONE SEEN
Trich, Wet Prep: NONE SEEN
Yeast Wet Prep HPF POC: NONE SEEN

## 2016-10-22 LAB — HCG, QUANTITATIVE, PREGNANCY: hCG, Beta Chain, Quant, S: 47347 m[IU]/mL — ABNORMAL HIGH (ref <5)

## 2016-10-22 MED ORDER — PROMETHAZINE HCL 25 MG PO TABS: 25.0000 mg | ORAL_TABLET | Freq: Four times a day (QID) | ORAL | 0 refills | Status: DC | PRN

## 2016-10-22 MED ORDER — POLYETHYLENE GLYCOL 3350 17 G PO PACK
17.0000 g | PACK | Freq: Every day | ORAL | 0 refills | Status: DC | PRN
Start: 2016-10-22 — End: 2016-12-04

## 2016-10-22 MED ORDER — DOCUSATE SODIUM 100 MG PO CAPS: 100.0000 mg | ORAL_CAPSULE | Freq: Two times a day (BID) | ORAL | 0 refills | Status: DC

## 2016-10-22 NOTE — MAU Provider Note (Signed)
History     CSN: [id number]  Arrival date and time: 10/22/16 [id number]   First Provider Initiated Contact with Patient 10/22/16 1959      Chief Complaint  Patient presents with  . Constipation  . Abdominal Pain   HPI  Brianna Sampson is a 36 y.o. [id number] at [redacted]w[redacted]d who presents with abdominal pain & constipation. States her last BM was over a Sampson ago. Only medication she is taking is prenatal vitamins. Has tried prune juice, apple juice, & fiber without relief. Normally has BM every 1-2 days. Last time she was constipated was during previous pregnancy.  Abdominal pain x 1 Sampson. Reports LLQ that is intermittent & sharp. Rates pain 6/10. Has not treated. Denies vaginal bleeding, vaginal discharge, vomiting, or fever. Some nausea. Has not started prenatal care.   OB History    Gravida Para Term Preterm AB Living   8 4 4   3 4    SAB TAB Ectopic Multiple Live Births   1 2     4       Past Medical History:  Diagnosis Date  . Anxiety   . Depression     Past Surgical History:  Procedure Laterality Date  . CHOLECYSTECTOMY  2002    No family history on file.  Social History  Substance Use Topics  . Smoking status: Current Every Day Smoker    Packs/day: 0.50    Types: Cigarettes  . Smokeless tobacco: Not on file  . Alcohol use No    Allergies: No Known Allergies  Prescriptions Prior to Admission  Medication Sig Dispense Refill Last Dose  . ibuprofen (ADVIL,MOTRIN) 200 MG tablet Take 400 mg by mouth daily as needed for headache.   More than a month at Unknown time  . lithium carbonate 150 MG capsule Take 150 mg by mouth 2 (two) times daily with a meal.   More than a month at Unknown time    Review of Systems  Constitutional: Negative.   Gastrointestinal: Positive for abdominal pain, constipation and nausea. Negative for anal bleeding, diarrhea, rectal pain and vomiting.  Genitourinary: Negative.    Physical Exam   Blood pressure 121/69, pulse 76, temperature 98.8 F (37.1  C), temperature source Oral, resp. rate 18, height 4\' 11"  (1.499 m), weight 196 lb 4 oz (89 kg), last menstrual period 09/10/2016, unknown if currently breastfeeding.  Physical Exam  Nursing note and vitals reviewed. Constitutional: She is oriented to person, place, and time. She appears well-developed and well-nourished. No distress.  HENT:  Head: Normocephalic and atraumatic.  Eyes: Conjunctivae are normal. Right eye exhibits no discharge. Left eye exhibits no discharge. No scleral icterus.  Neck: Normal range of motion.  Cardiovascular: Normal rate, regular rhythm and normal heart sounds.   No murmur heard. Respiratory: Effort normal and breath sounds normal. No respiratory distress. She has no wheezes.  GI: Soft. Bowel sounds are normal. She exhibits no distension. There is no tenderness. There is no rebound and no guarding.  Genitourinary: There is no rash, tenderness or lesion on the right labia. There is no rash, tenderness or lesion on the left labia. Cervix exhibits no motion tenderness.  Genitourinary Comments: Cervix closed  Neurological: She is alert and oriented to person, place, and time.  Skin: Skin is warm and dry. She is not diaphoretic.  Psychiatric: She has a normal mood and affect. Her behavior is normal. Judgment and thought content normal.    MAU Course  Procedures Results for orders placed or performed  during the hospital encounter of 10/22/16 (from the past 24 hour(s))  Urinalysis, Routine w reflex microscopic (not at [facility])     Status: Abnormal   Collection Time: 10/22/16  7:00 PM  Result Value Ref Range   Color, Urine YELLOW YELLOW   APPearance HAZY (A) CLEAR   Specific Gravity, Urine 1.024 1.005 - 1.030   pH 5.0 5.0 - 8.0   Glucose, UA NEGATIVE NEGATIVE mg/dL   Hgb urine dipstick NEGATIVE NEGATIVE   Bilirubin Urine NEGATIVE NEGATIVE   Ketones, ur NEGATIVE NEGATIVE mg/dL   Protein, ur NEGATIVE NEGATIVE mg/dL   Nitrite NEGATIVE NEGATIVE   Leukocytes, UA  NEGATIVE NEGATIVE  Pregnancy, urine POC     Status: Abnormal   Collection Time: 10/22/16  7:50 PM  Result Value Ref Range   Preg Test, Ur POSITIVE (A) NEGATIVE  CBC     Status: Abnormal   Collection Time: 10/22/16  8:13 PM  Result Value Ref Range   WBC 10.1 4.0 - 10.5 K/uL   RBC 4.13 3.87 - 5.11 MIL/uL   Hemoglobin 11.8 (L) 12.0 - 15.0 g/dL   HCT 35.4 (L) 36.0 - 46.0 %   MCV 85.7 78.0 - 100.0 fL   MCH 28.6 26.0 - 34.0 pg   MCHC 33.3 30.0 - 36.0 g/dL   RDW 16.9 (H) 11.5 - 15.5 %   Platelets 314 150 - 400 K/uL  hCG, quantitative, pregnancy     Status: Abnormal   Collection Time: 10/22/16  8:13 PM  Result Value Ref Range   hCG, Beta Chain, Quant, S 47,347 (H) <5 mIU/mL  Comprehensive metabolic panel     Status: Abnormal   Collection Time: 10/22/16  8:13 PM  Result Value Ref Range   Sodium 139 135 - 145 mmol/L   Potassium 3.9 3.5 - 5.1 mmol/L   Chloride 106 101 - 111 mmol/L   CO2 25 22 - 32 mmol/L   Glucose, Bld 100 (H) 65 - 99 mg/dL   BUN 10 6 - 20 mg/dL   Creatinine, Ser 0.65 0.44 - 1.00 mg/dL   Calcium 9.3 8.9 - 10.3 mg/dL   Total Protein 7.3 6.5 - 8.1 g/dL   Albumin 4.0 3.5 - 5.0 g/dL   AST 13 (L) 15 - 41 U/L   ALT 10 (L) 14 - 54 U/L   Alkaline Phosphatase 66 38 - 126 U/L   Total Bilirubin 0.3 0.3 - 1.2 mg/dL   GFR calc non Af Amer >60 >60 mL/min   GFR calc Af Amer >60 >60 mL/min   Anion gap 8 5 - 15  Lipase, blood     Status: None   Collection Time: 10/22/16  8:13 PM  Result Value Ref Range   Lipase 20 11 - 51 U/L  Wet prep, genital     Status: Abnormal   Collection Time: 10/22/16  9:25 PM  Result Value Ref Range   Yeast Wet Prep HPF POC NONE SEEN NONE SEEN   Trich, Wet Prep NONE SEEN NONE SEEN   Clue Cells Wet Prep HPF POC PRESENT (A) NONE SEEN   WBC, Wet Prep HPF POC FEW (A) NONE SEEN   Sperm NONE SEEN    US Ob Comp Less 14 Wks  Result Date: 10/22/2016 CLINICAL DATA:  Initial evaluation for acute severe abdominal pain, constipation for 3 weeks. Currently  pregnant. EXAM: OBSTETRIC <14 WK [place] AND TRANSVAGINAL OB US TECHNIQUE: Both transabdominal and transvaginal ultrasound examinations were performed for complete evaluation of the gestation as  well as the maternal uterus, adnexal regions, and pelvic cul-de-sac. Transvaginal technique was performed to assess early pregnancy. COMPARISON:  None. FINDINGS: Intrauterine gestational sac: Single Yolk sac:  Present Embryo:  Present Cardiac Activity: Present Heart Rate: 111  bpm MSD:   mm    w     d CRL:  4.1  mm   6 w   1 d                  [place] EDC: 06/16/2017 Subchorionic hemorrhage: Small subchorionic hemorrhage without mass effect. Maternal uterus/adnexae: Right ovary not visualized. Left ovary unremarkable. No free fluid. IMPRESSION: 1. Single viable intrauterine pregnancy as above. 2. Small subchorionic hemorrhage without significant mass effect. No other complication or other abnormality identified. Electronically Signed   By: Jeannine Boga M.D.   On: 10/22/2016 21:01   US Ob Transvaginal  Result Date: 10/22/2016 CLINICAL DATA:  Initial evaluation for acute severe abdominal pain, constipation for 3 weeks. Currently pregnant. EXAM: OBSTETRIC <14 WK [place] AND TRANSVAGINAL OB US TECHNIQUE: Both transabdominal and transvaginal ultrasound examinations were performed for complete evaluation of the gestation as well as the maternal uterus, adnexal regions, and pelvic cul-de-sac. Transvaginal technique was performed to assess early pregnancy. COMPARISON:  None. FINDINGS: Intrauterine gestational sac: Single Yolk sac:  Present Embryo:  Present Cardiac Activity: Present Heart Rate: 111  bpm MSD:   mm    w     d CRL:  4.1  mm   6 w   1 d                  [place] EDC: 06/16/2017 Subchorionic hemorrhage: Small subchorionic hemorrhage without mass effect. Maternal uterus/adnexae: Right ovary not visualized. Left ovary unremarkable. No free fluid. IMPRESSION: 1. Single viable intrauterine pregnancy as above. 2. Small subchorionic  hemorrhage without significant mass effect. No other complication or other abnormality identified. Electronically Signed   By: Jeannine Boga M.D.   On: 10/22/2016 21:01    MDM +UPT UA, wet prep, GC/chlamydia, CBC, ABO/Rh, quant hCG, HIV, and [place] today to rule out ectopic pregnancy B positive Ultrasound shows SIUP with cardiac activity & small [place] suds enema in MAU  Assessment and Plan  A: 1. Constipation, unspecified constipation type   2. Abdominal pain affecting pregnancy   3. Subchorionic hematoma in first trimester, single or unspecified fetus   4. Normal IUP (intrauterine pregnancy) on prenatal ultrasound, first trimester    P: Discharge home Start prenatal care Rx colace, miralax, & phenergan GC/CT pending Discussed tx of constipation at home & reasons to return to [place] 10/22/2016, 7:59 PM

## 2016-10-22 NOTE — Discharge Instructions (Signed)
Constipation, Adult Constipation is when a person has fewer bowel movements in a week than normal, has difficulty having a bowel movement, or has stools that are dry, hard, or larger than normal. Constipation may be caused by an underlying condition. It may become worse with age if a person takes certain medicines and does not take in enough fluids. Follow these instructions at home: Eating and drinking  Eat foods that have a lot of fiber, such as fresh fruits and vegetables, whole grains, and beans.  Limit foods that are high in fat, low in fiber, or overly processed, such as french fries, hamburgers, cookies, candies, and soda.  Drink enough fluid to keep your urine clear or pale yellow. General instructions  Exercise regularly or as told by your health care provider.  Go to the restroom when you have the urge to go. Do not hold it in.  Take over-the-counter and prescription medicines only as told by your health care provider. These include any fiber supplements.  Practice pelvic floor retraining exercises, such as deep breathing while relaxing the lower abdomen and pelvic floor relaxation during bowel movements.  Watch your condition for any changes.  Keep all follow-up visits as told by your health care provider. This is important. Contact a health care provider if:  You have pain that gets worse.  You have a fever.  You do not have a bowel movement after 4 days.  You vomit.  You are not hungry.  You lose weight.  You are bleeding from the anus.  You have thin, pencil-like stools. Get help right away if:  You have a fever and your symptoms suddenly get worse.  You leak stool or have blood in your stool.  Your abdomen is bloated.  You have severe pain in your abdomen.  You feel dizzy or you faint. This information is not intended to replace advice given to you by your health care provider. Make sure you discuss any questions you have with your health care  provider. Document Released: 05/25/2004 Document Revised: 03/16/2016 Document Reviewed: 02/15/2016 Elsevier Interactive Patient Education  2017 [place] Hematoma A subchorionic hematoma is a gathering of blood between the outer wall of the placenta and the inner wall of the womb (uterus). The placenta is the organ that connects the fetus to the wall of the uterus. The placenta performs the feeding, breathing (oxygen to the fetus), and waste removal (excretory work) of the fetus.  Subchorionic hematoma is the most common abnormality found on a result from ultrasonography done during the first trimester or early second trimester of pregnancy. If there has been little or no vaginal bleeding, early small hematomas usually shrink on their own and do not affect your baby or pregnancy. The blood is gradually absorbed over 1-2 weeks. When bleeding starts later in pregnancy or the hematoma is larger or occurs in an older pregnant woman, the outcome may not be as good. Larger hematomas may get bigger, which increases the chances for miscarriage. Subchorionic hematoma also increases the risk of premature detachment of the placenta from the uterus, preterm (premature) labor, and stillbirth. HOME CARE INSTRUCTIONS  Stay on bed rest if your health care provider recommends this. Although bed rest will not prevent more bleeding or prevent a miscarriage, your health care provider may recommend bed rest until you are advised otherwise.  Avoid heavy lifting (more than 10 lb [4.5 kg]), exercise, sexual intercourse, or douching as directed by your health care provider.  Keep track of  the number of pads you use each day and how soaked (saturated) they are. Write down this information.  Do not use tampons.  Keep all follow-up appointments as directed by your health care provider. Your health care provider may ask you to have follow-up blood tests or ultrasound tests or both. SEEK IMMEDIATE MEDICAL CARE  IF:  You have severe cramps in your stomach, back, abdomen, or pelvis.  You have a fever.  You pass large clots or tissue. Save any tissue for your health care provider to look at.  Your bleeding increases or you become lightheaded, feel weak, or have fainting episodes. This information is not intended to replace advice given to you by your health care provider. Make sure you discuss any questions you have with your health care provider. Document Released: 12/12/2006 Document Revised: 09/17/2014 Document Reviewed: 03/26/2013 Elsevier Interactive Patient Education  2017 [employer].

## 2016-10-22 NOTE — MAU Note (Signed)
Pt states that she is here for constipation. Has not had a BM in ~7 days. Tried today and was able to get a small amount out. Has increased fiber, taken prune juice, increased, etc-nothing helped. Pt did not take any laxative, stool softener or enema. Has some lower abdominal cramping-rates 6/10.

## 2016-10-22 NOTE — MAU Note (Signed)
Urine sent to lab 

## 2016-10-22 NOTE — MAU Note (Addendum)
Severe abd cramps, has been constipated for 3 wks, hasn't had a BM in over a wk. preg confirmed at HD last THurs.

## 2016-10-23 LAB — HIV ANTIBODY (ROUTINE TESTING W REFLEX): HIV Screen 4th Generation wRfx: NONREACTIVE

## 2016-10-23 LAB — GC/CHLAMYDIA PROBE AMP (~~LOC~~) NOT AT ARMC
Chlamydia: NEGATIVE
Neisseria Gonorrhea: NEGATIVE

## 2016-11-19 ENCOUNTER — Encounter: Payer: Medicaid Other | Admitting: Obstetrics

## 2016-11-21 ENCOUNTER — Encounter: Payer: Medicaid Other | Admitting: Obstetrics

## 2016-12-04 ENCOUNTER — Encounter: Payer: Self-pay | Admitting: Certified Nurse Midwife

## 2016-12-04 ENCOUNTER — Other Ambulatory Visit (HOSPITAL_COMMUNITY)
Admission: RE | Admit: 2016-12-04 | Discharge: 2016-12-04 | Disposition: A | Discharge status: Home / Self Care | Payer: Medicaid Other | Source: Ambulatory Visit | Attending: Certified Nurse Midwife | Admitting: Certified Nurse Midwife

## 2016-12-04 ENCOUNTER — Ambulatory Visit (INDEPENDENT_AMBULATORY_CARE_PROVIDER_SITE_OTHER): Payer: Medicaid Other | Admitting: Certified Nurse Midwife

## 2016-12-04 VITALS — BP 124/81 | HR 88 | Temp 98.5°F | Wt 200.2 lb

## 2016-12-04 DIAGNOSIS — O09521 Supervision of elderly multigravida, first trimester: Secondary | ICD-10-CM

## 2016-12-04 DIAGNOSIS — O98311 Other infections with a predominantly sexual mode of transmission complicating pregnancy, first trimester: Secondary | ICD-10-CM | POA: Insufficient documentation

## 2016-12-04 DIAGNOSIS — O0991 Supervision of high risk pregnancy, unspecified, first trimester: Secondary | ICD-10-CM | POA: Diagnosis present

## 2016-12-04 DIAGNOSIS — O0990 Supervision of high risk pregnancy, unspecified, unspecified trimester: Secondary | ICD-10-CM | POA: Insufficient documentation

## 2016-12-04 DIAGNOSIS — A5901 Trichomonal vulvovaginitis: Secondary | ICD-10-CM | POA: Insufficient documentation

## 2016-12-04 DIAGNOSIS — O09529 Supervision of elderly multigravida, unspecified trimester: Secondary | ICD-10-CM | POA: Insufficient documentation

## 2016-12-04 MED ORDER — PRENATE PIXIE 10-0.6-0.4-200 MG PO CAPS: 1.0000 | ORAL_CAPSULE | Freq: Every day | ORAL | 12 refills | Status: DC

## 2016-12-04 NOTE — Progress Notes (Signed)
Patient is in the office for initial ob visit, states that she feels good today.

## 2016-12-04 NOTE — Progress Notes (Signed)
Subjective:    Brianna Sampson is being seen today for her first obstetrical visit.  This is a planned pregnancy. She is at [redacted]w[redacted]d gestation. Her obstetrical history is significant for advanced maternal age and previous successful VBAC X2, desires repeat TOLAC. Relationship with FOB: spouse, living together. Patient does intend to breast feed. Pregnancy history fully reviewed.  The information documented in the HPI was reviewed and verified.  Menstrual History: OB History    Gravida Para Term Preterm AB Living   8 4 4   3 4    SAB TAB Ectopic Multiple Live Births   1 2     4        Patient's last menstrual period was 09/10/2016.    Past Medical History:  Diagnosis Date  . Anxiety   . Depression     Past Surgical History:  Procedure Laterality Date  . CESAREAN SECTION    . CHOLECYSTECTOMY  2002     (Not in a hospital admission) No Known Allergies  Social History  Substance Use Topics  . Smoking status: Current Every Day Smoker    Packs/day: 0.50    Types: Cigarettes  . Smokeless tobacco: Never Used  . Alcohol use No    Family History  Problem Relation Age of Onset  . Hypertension Mother   . Diabetes Mother   . Heart disease Mother      Review of Systems Constitutional: negative for weight loss Gastrointestinal: negative for vomiting Genitourinary:negative for genital lesions and vaginal discharge and dysuria Musculoskeletal:negative for back pain Behavioral/Psych: negative for abusive relationship, depression, illegal drug usage and tobacco use    Objective:    BP 124/81   Pulse 88   Temp 98.5 F (36.9 C)   Wt 200 lb 3.2 oz (90.8 kg)   LMP 09/10/2016   BMI 40.44 kg/m  General Appearance:    Alert, cooperative, no distress, appears stated age  Head:    Normocephalic, without obvious abnormality, atraumatic  Eyes:    PERRL, conjunctiva/corneas clear, EOM's intact, fundi    benign, both eyes  Ears:    Normal TM's and external ear canals, both ears  Nose:    Nares normal, septum midline, mucosa normal, no drainage    or sinus tenderness  Throat:   Lips, mucosa, and tongue normal; teeth and gums normal  Neck:   Supple, symmetrical, trachea midline, no adenopathy;    thyroid:  no enlargement/tenderness/nodules; no carotid   bruit or JVD  Back:     Symmetric, no curvature, ROM normal, no CVA tenderness  Lungs:     Clear to auscultation bilaterally, respirations unlabored  Chest Wall:    No tenderness or deformity   Heart:    Regular rate and rhythm, S1 and S2 normal, no murmur, rub   or gallop  Breast Exam:    No tenderness, masses, or nipple abnormality  Abdomen:     Soft, non-tender, bowel sounds active all four quadrants,    no masses, no organomegaly  Genitalia:    Normal female without lesion, discharge or tenderness  Extremities:   Extremities normal, atraumatic, no cyanosis or edema  Pulses:   2+ and symmetric all extremities  Skin:   Skin color, texture, turgor normal, no rashes or lesions  Lymph nodes:   Cervical, supraclavicular, and axillary nodes normal  Neurologic:   CNII-XII intact, normal strength, sensation and reflexes    throughout      Lab Review Urine pregnancy test Labs reviewed yes Radiologic studies  reviewed no Assessment:    Pregnancy at [redacted]w[redacted]d weeks   Supervision of high risk pregnancy, antepartum - Plan: Cytology - PAP, Cervicovaginal ancillary only, Hemoglobinopathy evaluation, Vitamin D (25 hydroxy), Varicella zoster antibody, IgG, Culture, OB Urine, Obstetric Panel, Including HIV, Cystic Fibrosis Mutation 97, TSH, MaterniT Genome, Hemoglobin A1c, Prenat-FeAsp-Meth-FA-DHA w/o A (PRENATE PIXIE) 10-0.6-0.4-200 MG CAPS  Elderly multigravida in first trimester - Plan: Cytology - PAP, Cervicovaginal ancillary only, Hemoglobinopathy evaluation, Vitamin D (25 hydroxy), Varicella zoster antibody, IgG, Culture, OB Urine, Obstetric Panel, Including HIV, Cystic Fibrosis Mutation 63, TSH, MaterniT Genome   Plan:       Prenatal vitamins.  Counseling provided regarding continued use of seat belts, cessation of alcohol consumption, smoking or use of illicit drugs; infection precautions i.e., influenza/TDAP immunizations, toxoplasmosis,CMV, parvovirus, listeria and varicella; workplace safety, exercise during pregnancy; routine dental care, safe medications, sexual activity, hot tubs, saunas, pools, travel, caffeine use, fish and methlymercury, potential toxins, hair treatments, varicose veins Weight gain recommendations per IOM guidelines reviewed: underweight/BMI< 18.5--> gain 28 - 40 lbs; normal weight/BMI 18.5 - 24.9--> gain 25 - 35 lbs; overweight/BMI 25 - 29.9--> gain 15 - 25 lbs; obese/BMI >30->gain  11 - 20 lbs Problem list reviewed and updated. FIRST/CF mutation testing/NIPT/QUAD SCREEN/fragile X/Ashkenazi Jewish population testing/Spinal muscular atrophy discussed: ordered. Role of ultrasound in pregnancy discussed; fetal survey: requested. Amniocentesis discussed: not indicated. VBAC calculator score:95% VBAC consent form provided Meds ordered this encounter  Medications  . Prenat-FeAsp-Meth-FA-DHA w/o A (PRENATE PIXIE) 10-0.6-0.4-200 MG CAPS    Sig: Take 1 tablet by mouth daily.    Dispense:  30 capsule    Refill:  12    Please process coupon: Rx BIN: [id number], RxPCN: [id number], RxGRP: [id number], RxID: [id number]  SUF: 01   Orders Placed This Encounter  Procedures  . Culture, OB Urine  . Hemoglobinopathy evaluation  . Vitamin D (25 hydroxy)  . Varicella zoster antibody, IgG  . Obstetric Panel, Including HIV  . Cystic Fibrosis Mutation 97  . TSH  . MaterniT Genome    Order Specific Question:   Is the patient insulin dependent?    Answer:   No    Order Specific Question:   Please enter gestational age. This should be expressed as weeks AND days, i.e. 16w 6d. Enter weeks here. Enter days in next question.    Answer:   91    Order Specific Question:   Please enter gestational age. This should be  expressed as weeks AND days, i.e. 16w 6d. Enter days here. Enter weeks in previous question.    Answer:   1    Order Specific Question:   How was gestational age calculated?    Answer:   LMP    Order Specific Question:   Please give the date of LMP OR Ultrasound OR Estimated date of delivery.    Answer:   06/17/2017    Order Specific Question:   Number of Fetuses (Type of Pregnancy):    Answer:   1    Order Specific Question:   Indications for performing the test? (please choose all that apply):    Answer:   Routine screening    Order Specific Question:   Other Indications? (Y=Yes, N=No)    Answer:   Y    Order Specific Question:   Please specify other indications, if any:    Answer:   AMA    Order Specific Question:   If this is a repeat specimen, please indicate the reason:  Answer:   Not indicated    Order Specific Question:   Please specify the patient's race: (C=White/Caucasion, B=Black, I=Native American, A=Asian, H=Hispanic, O=Other, U=Unknown)    Answer:   B    Order Specific Question:   Donor Egg - indicate if the egg was obtained from in vitro fertilization.    Answer:   N    Order Specific Question:   Age of Egg Donor.    Answer:   22    Order Specific Question:   Prior Down Syndrome/ONTD screening during current pregnancy.    Answer:   N    Order Specific Question:   Prior First Trimester Testing    Answer:   N    Order Specific Question:   Prior Second Trimester Testing    Answer:   N    Order Specific Question:   Family History of Neural Tube Defects    Answer:   N    Order Specific Question:   Prior Pregnancy with Down Syndrome    Answer:   N    Order Specific Question:   Please give the patient's weight (in pounds)    Answer:   200  . Hemoglobin A1c    Follow up in 4 weeks. 50% of 30 min visit spent on counseling and coordination of care.

## 2016-12-06 ENCOUNTER — Other Ambulatory Visit: Payer: Self-pay | Admitting: Certified Nurse Midwife

## 2016-12-06 DIAGNOSIS — A599 Trichomoniasis, unspecified: Secondary | ICD-10-CM

## 2016-12-06 LAB — CERVICOVAGINAL ANCILLARY ONLY
BACTERIAL VAGINITIS: NEGATIVE
CANDIDA VAGINITIS: NEGATIVE
Chlamydia: NEGATIVE
NEISSERIA GONORRHEA: NEGATIVE
Trichomonas: POSITIVE — AB

## 2016-12-06 MED ORDER — METRONIDAZOLE 500 MG PO TABS: 2000.0000 mg | ORAL_TABLET | Freq: Once | ORAL | 0 refills | Status: AC

## 2016-12-10 ENCOUNTER — Other Ambulatory Visit: Payer: Self-pay | Admitting: *Deleted

## 2016-12-10 ENCOUNTER — Other Ambulatory Visit: Payer: Self-pay | Admitting: Certified Nurse Midwife

## 2016-12-10 DIAGNOSIS — O2340 Unspecified infection of urinary tract in pregnancy, unspecified trimester: Secondary | ICD-10-CM | POA: Insufficient documentation

## 2016-12-10 DIAGNOSIS — O2342 Unspecified infection of urinary tract in pregnancy, second trimester: Secondary | ICD-10-CM

## 2016-12-10 LAB — CYTOLOGY - PAP
Diagnosis: NEGATIVE
HPV (WINDOPATH): NOT DETECTED

## 2016-12-10 LAB — CULTURE, OB URINE

## 2016-12-10 LAB — URINE CULTURE, OB REFLEX

## 2016-12-10 MED ORDER — TERCONAZOLE 0.4 % VA CREA: 1.0000 | TOPICAL_CREAM | Freq: Every day | VAGINAL | 0 refills | Status: DC

## 2016-12-10 MED ORDER — NITROFURANTOIN MONOHYD MACRO 100 MG PO CAPS: 100.0000 mg | ORAL_CAPSULE | Freq: Two times a day (BID) | ORAL | 0 refills | Status: DC

## 2016-12-10 NOTE — Progress Notes (Signed)
See lab note.  

## 2016-12-11 LAB — HEMOGLOBINOPATHY EVALUATION
HEMOGLOBIN A2 QUANTITATION: 2.5 % (ref 1.8–3.2)
HGB C: 0 %
HGB S: 0 %
HGB VARIANT: 0 %
Hemoglobin F Quantitation: 0 % (ref 0.0–2.0)
Hgb A: 97.5 % (ref 96.4–98.8)

## 2016-12-11 LAB — CYSTIC FIBROSIS MUTATION 97: GENE DIS ANAL CARRIER INTERP BLD/T-IMP: NOT DETECTED

## 2016-12-11 LAB — VITAMIN D 25 HYDROXY (VIT D DEFICIENCY, FRACTURES): VIT D 25 HYDROXY: 17.6 ng/mL — AB (ref 30.0–100.0)

## 2016-12-11 LAB — OBSTETRIC PANEL, INCLUDING HIV
Antibody Screen: NEGATIVE
Basophils Absolute: 0 10*3/uL (ref 0.0–0.2)
Basos: 0 %
EOS (ABSOLUTE): 0.2 10*3/uL (ref 0.0–0.4)
EOS: 2 %
HIV Screen 4th Generation wRfx: NONREACTIVE
Hematocrit: 32.5 % — ABNORMAL LOW (ref 34.0–46.6)
Hemoglobin: 10.7 g/dL — ABNORMAL LOW (ref 11.1–15.9)
Hepatitis B Surface Ag: NEGATIVE
IMMATURE GRANS (ABS): 0 10*3/uL (ref 0.0–0.1)
IMMATURE GRANULOCYTES: 0 %
LYMPHS ABS: 2.7 10*3/uL (ref 0.7–3.1)
Lymphs: 24 %
MCH: 28.8 pg (ref 26.6–33.0)
MCHC: 32.9 g/dL (ref 31.5–35.7)
MCV: 88 fL (ref 79–97)
MONOS ABS: 1 10*3/uL — AB (ref 0.1–0.9)
Monocytes: 8 %
NEUTROS PCT: 66 %
Neutrophils Absolute: 7.6 10*3/uL — ABNORMAL HIGH (ref 1.4–7.0)
PLATELETS: 316 10*3/uL (ref 150–379)
RBC: 3.71 x10E6/uL — AB (ref 3.77–5.28)
RDW: 16.5 % — AB (ref 12.3–15.4)
RH TYPE: POSITIVE
RPR Ser Ql: NONREACTIVE
Rubella Antibodies, IGG: 2 index (ref >0.99)
WBC: 11.5 10*3/uL — AB (ref 3.4–10.8)

## 2016-12-11 LAB — VARICELLA ZOSTER ANTIBODY, IGG: Varicella zoster IgG: 2251 index (ref >165)

## 2016-12-11 LAB — HEMOGLOBIN A1C
Est. average glucose Bld gHb Est-mCnc: 108 mg/dL
Hgb A1c MFr Bld: 5.4 % (ref 4.8–5.6)

## 2016-12-11 LAB — MATERNIT GENOME

## 2016-12-11 LAB — TSH

## 2016-12-13 ENCOUNTER — Other Ambulatory Visit: Payer: Self-pay | Admitting: Certified Nurse Midwife

## 2016-12-13 DIAGNOSIS — R7989 Other specified abnormal findings of blood chemistry: Secondary | ICD-10-CM

## 2016-12-13 DIAGNOSIS — O0990 Supervision of high risk pregnancy, unspecified, unspecified trimester: Secondary | ICD-10-CM

## 2016-12-13 DIAGNOSIS — O99011 Anemia complicating pregnancy, first trimester: Secondary | ICD-10-CM

## 2016-12-13 MED ORDER — VITAMIN D (ERGOCALCIFEROL) 1.25 MG (50000 UNIT) PO CAPS: 50000.0000 [IU] | ORAL_CAPSULE | ORAL | 2 refills | Status: DC

## 2016-12-13 MED ORDER — CITRANATAL BLOOM 90-1 MG PO TABS: 1.0000 | ORAL_TABLET | Freq: Every day | ORAL | 12 refills | Status: DC

## 2016-12-17 ENCOUNTER — Other Ambulatory Visit: Payer: Self-pay | Admitting: Certified Nurse Midwife

## 2016-12-17 DIAGNOSIS — O0990 Supervision of high risk pregnancy, unspecified, unspecified trimester: Secondary | ICD-10-CM

## 2016-12-18 ENCOUNTER — Encounter (HOSPITAL_COMMUNITY): Payer: Self-pay | Admitting: *Deleted

## 2016-12-18 ENCOUNTER — Inpatient Hospital Stay (HOSPITAL_COMMUNITY)
Admission: AD | Admit: 2016-12-18 | Discharge: 2016-12-19 | Disposition: A | Discharge status: Home / Self Care | Payer: Medicaid Other | Source: Ambulatory Visit | Attending: Family Medicine | Admitting: Family Medicine

## 2016-12-18 ENCOUNTER — Inpatient Hospital Stay (HOSPITAL_COMMUNITY): Payer: Medicaid Other

## 2016-12-18 DIAGNOSIS — Z3A14 14 weeks gestation of pregnancy: Secondary | ICD-10-CM | POA: Diagnosis not present

## 2016-12-18 DIAGNOSIS — O34219 Maternal care for unspecified type scar from previous cesarean delivery: Secondary | ICD-10-CM | POA: Insufficient documentation

## 2016-12-18 DIAGNOSIS — O4690 Antepartum hemorrhage, unspecified, unspecified trimester: Secondary | ICD-10-CM

## 2016-12-18 DIAGNOSIS — Z6790 Unspecified blood type, Rh positive: Secondary | ICD-10-CM | POA: Diagnosis not present

## 2016-12-18 DIAGNOSIS — O99342 Other mental disorders complicating pregnancy, second trimester: Secondary | ICD-10-CM | POA: Diagnosis not present

## 2016-12-18 DIAGNOSIS — F17210 Nicotine dependence, cigarettes, uncomplicated: Secondary | ICD-10-CM | POA: Insufficient documentation

## 2016-12-18 DIAGNOSIS — Z8249 Family history of ischemic heart disease and other diseases of the circulatory system: Secondary | ICD-10-CM | POA: Diagnosis not present

## 2016-12-18 DIAGNOSIS — N939 Abnormal uterine and vaginal bleeding, unspecified: Secondary | ICD-10-CM | POA: Insufficient documentation

## 2016-12-18 DIAGNOSIS — O99332 Smoking (tobacco) complicating pregnancy, second trimester: Secondary | ICD-10-CM | POA: Insufficient documentation

## 2016-12-18 DIAGNOSIS — O26899 Other specified pregnancy related conditions, unspecified trimester: Secondary | ICD-10-CM | POA: Diagnosis not present

## 2016-12-18 DIAGNOSIS — O26892 Other specified pregnancy related conditions, second trimester: Secondary | ICD-10-CM | POA: Diagnosis not present

## 2016-12-18 DIAGNOSIS — F419 Anxiety disorder, unspecified: Secondary | ICD-10-CM | POA: Insufficient documentation

## 2016-12-18 DIAGNOSIS — Z9049 Acquired absence of other specified parts of digestive tract: Secondary | ICD-10-CM | POA: Diagnosis not present

## 2016-12-18 DIAGNOSIS — O09522 Supervision of elderly multigravida, second trimester: Secondary | ICD-10-CM | POA: Diagnosis not present

## 2016-12-18 DIAGNOSIS — F329 Major depressive disorder, single episode, unspecified: Secondary | ICD-10-CM | POA: Insufficient documentation

## 2016-12-18 DIAGNOSIS — R109 Unspecified abdominal pain: Secondary | ICD-10-CM | POA: Insufficient documentation

## 2016-12-18 DIAGNOSIS — O209 Hemorrhage in early pregnancy, unspecified: Secondary | ICD-10-CM | POA: Insufficient documentation

## 2016-12-18 DIAGNOSIS — Z833 Family history of diabetes mellitus: Secondary | ICD-10-CM | POA: Insufficient documentation

## 2016-12-18 DIAGNOSIS — Z79899 Other long term (current) drug therapy: Secondary | ICD-10-CM | POA: Insufficient documentation

## 2016-12-18 LAB — CBC
HEMATOCRIT: 31.3 % — AB (ref 36.0–46.0)
Hemoglobin: 10.8 g/dL — ABNORMAL LOW (ref 12.0–15.0)
MCH: 30 pg (ref 26.0–34.0)
MCHC: 34.5 g/dL (ref 30.0–36.0)
MCV: 86.9 fL (ref 78.0–100.0)
Platelets: 268 10*3/uL (ref 150–400)
RBC: 3.6 MIL/uL — AB (ref 3.87–5.11)
RDW: 15.8 % — AB (ref 11.5–15.5)
WBC: 12.6 10*3/uL — AB (ref 4.0–10.5)

## 2016-12-18 NOTE — MAU Provider Note (Signed)
History     CSN: [id number]  Arrival date and time: 12/18/16 2142   First Provider Initiated Contact with Patient 12/18/16 2250      Chief Complaint  Patient presents with  . Vaginal Bleeding   G1 @14  wks here with VB. Sx started 2 hrs ago. She felt a gush of fluid from vagina and was found to be bright red blood. No cramping but feels pelvic pressure. No vaginal discharge prior to today. No fevers. Pregnancy complicated by [facility] and +trich recently.   OB History    Gravida Para Term Preterm AB Living   8 4 4   3 4    SAB TAB Ectopic Multiple Live Births   1 2     4       Past Medical History:  Diagnosis Date  . Anxiety   . Depression     Past Surgical History:  Procedure Laterality Date  . CESAREAN SECTION    . CHOLECYSTECTOMY  2002    Family History  Problem Relation Age of Onset  . Hypertension Mother   . Diabetes Mother   . Heart disease Mother     Social History  Substance Use Topics  . Smoking status: Current Every Day Smoker    Packs/day: 0.50    Types: Cigarettes  . Smokeless tobacco: Never Used  . Alcohol use No    Allergies: No Known Allergies  Prescriptions Prior to Admission  Medication Sig Dispense Refill Last Dose  . acetaminophen (TYLENOL) 500 MG tablet Take 1,000 mg by mouth every 6 (six) hours as needed for mild pain or headache.   12/17/2016 at Unknown time  . nitrofurantoin, macrocrystal-monohydrate, (MACROBID) 100 MG capsule Take 1 capsule (100 mg total) by mouth 2 (two) times daily. 14 capsule 0 12/18/2016 at Unknown time  . Prenatal Vit-Fe Fumarate-FA (PRENATAL MULTIVITAMIN) TABS tablet Take 1 tablet by mouth daily at 12 noon.   12/18/2016 at Unknown time  . Prenat-FeAsp-Meth-FA-DHA w/o A (PRENATE PIXIE) 10-0.6-0.4-200 MG CAPS Take 1 tablet by mouth daily. 30 capsule 12   . Prenatal-DSS-FeCb-FeGl-FA (CITRANATAL BLOOM) 90-1 MG TABS Take 1 tablet by mouth daily. 30 tablet 12   . terconazole (TERAZOL 7) 0.4 % vaginal cream Place 1 applicator  vaginally at bedtime. (Patient not taking: Reported on 12/18/2016) 45 g 0 Not Taking at Unknown time  . Vitamin D, Ergocalciferol, (DRISDOL) 50000 units CAPS capsule Take 1 capsule (50,000 Units total) by mouth every 7 (seven) days. (Patient not taking: Reported on 12/18/2016) 30 capsule 2 Not Taking at Unknown time    Review of Systems  Constitutional: Negative for fever.  Gastrointestinal: Negative for abdominal pain.  Genitourinary: Positive for vaginal bleeding. Negative for pelvic pain and vaginal discharge.   Physical Exam   Blood pressure 125/65, pulse 89, temperature 98.3 F (36.8 C), temperature source Oral, resp. rate 20, height 4\' 11"  (1.499 m), weight 90.8 kg (200 lb 4 oz), last menstrual period 09/10/2016, unknown if currently breastfeeding.  Physical Exam  Nursing note and vitals reviewed. Constitutional: She is oriented to person, place, and time. She appears well-developed and well-nourished. No distress.  HENT:  Head: Normocephalic and atraumatic.  Neck: Normal range of motion.  Cardiovascular: Normal rate.   Respiratory: Effort normal.  GI: Soft. She exhibits no distension and no mass. There is tenderness (all quadrants). There is no rebound and no guarding.  Genitourinary:  Genitourinary Comments: External: no lesions or erythema Vagina: rugated, nulli, moderate bloody discharge, no bleeding from os Uterus: + enlarged,  anteverted, non tender, no CMT Cervix closed/long  Musculoskeletal: Normal range of motion.  Neurological: She is alert and oriented to person, place, and time.  Skin: Skin is warm and dry.  Psychiatric: She has a normal mood and affect.   FHT: 154 bpm  Results for orders placed or performed during the hospital encounter of 12/18/16 (from the past 24 hour(s))  CBC     Status: Abnormal   Collection Time: 12/18/16 11:02 PM  Result Value Ref Range   WBC 12.6 (H) 4.0 - 10.5 K/uL   RBC 3.60 (L) 3.87 - 5.11 MIL/uL   Hemoglobin 10.8 (L) 12.0 - 15.0  g/dL   HCT 31.3 (L) 36.0 - 46.0 %   MCV 86.9 78.0 - 100.0 fL   MCH 30.0 26.0 - 34.0 pg   MCHC 34.5 30.0 - 36.0 g/dL   RDW 15.8 (H) 11.5 - 15.5 %   Platelets 268 150 - 400 K/uL   [place] (prelim): +FHT, no abruption MAU Course  Procedures Tylenol 1g  MDM Labs and [place] ordered and reviewed. No evidence of abruption. Unclear source of bleeding. Small amt of bleeding since arrival. Stable for discharge home.  Assessment and Plan   1. [redacted] weeks gestation of pregnancy   2. Vaginal bleeding in pregnancy   3. Blood type, Rh positive   4. Abdominal cramping affecting pregnancy    Discharge home Follow up in office as scheduled SAB precautions  Allergies as of 12/19/2016   No Known Allergies     Medication List    STOP taking these medications   CITRANATAL BLOOM 90-1 MG Tabs   PRENATE PIXIE 10-0.6-0.4-200 MG Caps   terconazole 0.4 % vaginal cream Commonly known as:  TERAZOL 7     TAKE these medications   acetaminophen 500 MG tablet Commonly known as:  TYLENOL Take 1,000 mg by mouth every 6 (six) hours as needed for mild pain or headache.   nitrofurantoin (macrocrystal-monohydrate) 100 MG capsule Commonly known as:  MACROBID Take 1 capsule (100 mg total) by mouth 2 (two) times daily.   prenatal multivitamin Tabs tablet Take 1 tablet by mouth daily at 12 noon.   Vitamin D (Ergocalciferol) 50000 units Caps capsule Commonly known as:  DRISDOL Take 1 capsule (50,000 Units total) by mouth every 7 (seven) days.      Julianne Handler, CNM 12/18/2016, 10:58 PM

## 2016-12-18 NOTE — MAU Note (Signed)
PT  SAYS SHE WAS AT SCHOOL AT 915PM-  FELT  CRAMPS-   AND  WETNESS - TO B-ROOM - SAW  BLEEDING.    IN TRIAGE-  RED  VAG  BLEEDING-     SOME  CRAMPS  NOW

## 2016-12-19 DIAGNOSIS — R109 Unspecified abdominal pain: Secondary | ICD-10-CM

## 2016-12-19 DIAGNOSIS — Z6790 Unspecified blood type, Rh positive: Secondary | ICD-10-CM | POA: Diagnosis not present

## 2016-12-19 DIAGNOSIS — Z3A14 14 weeks gestation of pregnancy: Secondary | ICD-10-CM

## 2016-12-19 DIAGNOSIS — O26899 Other specified pregnancy related conditions, unspecified trimester: Secondary | ICD-10-CM

## 2016-12-19 DIAGNOSIS — O4690 Antepartum hemorrhage, unspecified, unspecified trimester: Secondary | ICD-10-CM

## 2016-12-19 DIAGNOSIS — O09522 Supervision of elderly multigravida, second trimester: Secondary | ICD-10-CM

## 2016-12-19 MED ORDER — ACETAMINOPHEN 500 MG PO TABS
1000.0000 mg | ORAL_TABLET | Freq: Four times a day (QID) | ORAL | Status: DC | PRN
  Administered 2016-12-19: 1000 mg via ORAL
  Filled 2016-12-19: qty 2

## 2016-12-19 NOTE — Discharge Instructions (Signed)
Vaginal Bleeding During Pregnancy, Second Trimester A small amount of bleeding (spotting) from the vagina is common in pregnancy. Sometimes the bleeding is normal and is not a problem, and sometimes it is a sign of something serious. Be sure to tell your doctor about any bleeding from your vagina right away. Follow these instructions at home:  Watch your condition for any changes.  Follow your doctor's instructions about how active you can be.  If you are on bed rest:  You may need to stay in bed and only get up to use the bathroom.  You may be allowed to do some activities.  If you need help, make plans for someone to help you.  Write down:  The number of pads you use each day.  How often you change pads.  How soaked (saturated) your pads are.  Do not use tampons.  Do not douche.  Do not have sex or orgasms until your doctor says it is okay.  If you pass any tissue from your vagina, save the tissue so you can show it to your doctor.  Only take medicines as told by your doctor.  Do not take aspirin because it can make you bleed.  Do not exercise, lift heavy weights, or do any activities that take a lot of energy and effort unless your doctor says it is okay.  Keep all follow-up visits as told by your doctor. Contact a doctor if:  You bleed from your vagina.  You have cramps.  You have labor pains.  You have a fever that does not go away after you take medicine. Get help right away if:  You have very bad cramps in your back or belly (abdomen).  You have contractions.  You have chills.  You pass large clots or tissue from your vagina.  You bleed more.  You feel light-headed or weak.  You pass out (faint).  You are leaking fluid or have a gush of fluid from your vagina. This information is not intended to replace advice given to you by your health care provider. Make sure you discuss any questions you have with your health care provider. Document  Released: 01/11/2014 Document Revised: 02/02/2016 Document Reviewed: 05/04/2013 Elsevier Interactive Patient Education  2017 [employer].

## 2016-12-24 ENCOUNTER — Encounter: Payer: Self-pay | Admitting: Certified Nurse Midwife

## 2017-01-01 ENCOUNTER — Ambulatory Visit (INDEPENDENT_AMBULATORY_CARE_PROVIDER_SITE_OTHER): Payer: Medicaid Other | Admitting: Certified Nurse Midwife

## 2017-01-01 ENCOUNTER — Encounter: Payer: Self-pay | Admitting: Certified Nurse Midwife

## 2017-01-01 VITALS — BP 136/83 | HR 95 | Wt 199.0 lb

## 2017-01-01 DIAGNOSIS — E559 Vitamin D deficiency, unspecified: Secondary | ICD-10-CM

## 2017-01-01 DIAGNOSIS — O2342 Unspecified infection of urinary tract in pregnancy, second trimester: Secondary | ICD-10-CM

## 2017-01-01 DIAGNOSIS — D649 Anemia, unspecified: Secondary | ICD-10-CM

## 2017-01-01 DIAGNOSIS — O0992 Supervision of high risk pregnancy, unspecified, second trimester: Secondary | ICD-10-CM

## 2017-01-01 DIAGNOSIS — O99012 Anemia complicating pregnancy, second trimester: Secondary | ICD-10-CM | POA: Diagnosis not present

## 2017-01-01 DIAGNOSIS — O0990 Supervision of high risk pregnancy, unspecified, unspecified trimester: Secondary | ICD-10-CM

## 2017-01-01 DIAGNOSIS — O09522 Supervision of elderly multigravida, second trimester: Secondary | ICD-10-CM

## 2017-01-01 DIAGNOSIS — O99011 Anemia complicating pregnancy, first trimester: Secondary | ICD-10-CM

## 2017-01-01 DIAGNOSIS — R7989 Other specified abnormal findings of blood chemistry: Secondary | ICD-10-CM

## 2017-01-01 NOTE — Progress Notes (Signed)
   PRENATAL VISIT NOTE  Subjective:  Brianna Sampson is a 36 y.o. [id number] at [redacted]w[redacted]d being seen today for ongoing prenatal care.  She is currently monitored for the following issues for this high-risk pregnancy and has OBESITY; NICOTINE ADDICTION; DEPRESSION; ARTHRITIS, RIGHT KNEE; Supervision of high risk pregnancy, antepartum; AMA (advanced maternal age) multigravida 75+; Trichomoniasis; UTI (urinary tract infection) during pregnancy; Low vitamin D level; and Anemia affecting pregnancy in first trimester on her problem list.  Patient reports no complaints.  Contractions: Not present. Vag. Bleeding: None.  Movement: Present. Denies leaking of fluid.   The following portions of the patient's history were reviewed and updated as appropriate: allergies, current medications, past family history, past medical history, past social history, past surgical history and problem list. Problem list updated.  Objective:   Vitals:   01/01/17 1425  BP: 136/83  Pulse: 95  Weight: 199 lb (90.3 kg)    Fetal Status: Fetal Heart Rate (bpm): 153 Fundal Height: 17 cm Movement: Present     General:  Alert, oriented and cooperative. Patient is in no acute distress.  Skin: Skin is warm and dry. No rash noted.   Cardiovascular: Normal heart rate noted  Respiratory: Normal respiratory effort, no problems with respiration noted  Abdomen: Soft, gravid, appropriate for gestational age. Pain/Pressure: Present     Pelvic:  Cervical exam deferred        Extremities: Normal range of motion.  Edema: Mild pitting, slight indentation  Mental Status: Normal mood and affect. Normal behavior. Normal judgment and thought content.   Assessment and Plan:  Pregnancy: [id number] at [redacted]w[redacted]d  1. Supervision of high risk pregnancy, antepartum     Note from MAU reviewed.  No current vaginal bleeding.  Does report recent tooth ache with treatment.   - AFP, Serum, Open Spina Bifida - [place] MFM OB COMP + 14 WK; Future  2. Elderly  multigravida in second trimester     AFP today.  [place] scheduled.   3. Low vitamin D level     Taking weekly vitamin D  4. Anemia affecting pregnancy in first trimester      Taking OTC iron  5. Urinary tract infection in mother during second trimester of pregnancy     TOC today - Culture, OB Urine  Preterm labor symptoms and general obstetric precautions including but not limited to vaginal bleeding, contractions, leaking of fluid and fetal movement were reviewed in detail with the patient. Please refer to After Visit Summary for other counseling recommendations.  Return in about 4 weeks (around 01/29/2017) for [place], [place].   Morene Crocker, CNM

## 2017-01-04 LAB — CULTURE, OB URINE

## 2017-01-04 LAB — URINE CULTURE, OB REFLEX

## 2017-01-08 ENCOUNTER — Other Ambulatory Visit: Payer: Self-pay | Admitting: Certified Nurse Midwife

## 2017-01-08 LAB — AFP, SERUM, OPEN SPINA BIFIDA
AFP MoM: 1.1
AFP VALUE AFPOSL: 33.9 ng/mL
Gest. Age on Collection Date: 16.1 weeks
Maternal Age At EDD: 38 yr
OSBR RISK 1 IN: 10000
Test Results:: NEGATIVE
WEIGHT: 199 [lb_av]

## 2017-01-09 ENCOUNTER — Other Ambulatory Visit: Payer: Self-pay | Admitting: Certified Nurse Midwife

## 2017-01-09 DIAGNOSIS — O0990 Supervision of high risk pregnancy, unspecified, unspecified trimester: Secondary | ICD-10-CM

## 2017-01-16 ENCOUNTER — Other Ambulatory Visit: Payer: Self-pay | Admitting: Certified Nurse Midwife

## 2017-01-16 ENCOUNTER — Encounter (HOSPITAL_COMMUNITY): Payer: Self-pay

## 2017-01-16 ENCOUNTER — Ambulatory Visit (HOSPITAL_COMMUNITY)
Admission: RE | Admit: 2017-01-16 | Discharge: 2017-01-16 | Disposition: A | Discharge status: Home / Self Care | Payer: Medicaid Other | Source: Ambulatory Visit | Attending: Certified Nurse Midwife | Admitting: Certified Nurse Midwife

## 2017-01-16 VITALS — BP 117/59 | HR 95 | Wt 200.6 lb

## 2017-01-16 DIAGNOSIS — Z3A18 18 weeks gestation of pregnancy: Secondary | ICD-10-CM | POA: Insufficient documentation

## 2017-01-16 DIAGNOSIS — O99332 Smoking (tobacco) complicating pregnancy, second trimester: Secondary | ICD-10-CM | POA: Diagnosis not present

## 2017-01-16 DIAGNOSIS — O09522 Supervision of elderly multigravida, second trimester: Secondary | ICD-10-CM | POA: Diagnosis not present

## 2017-01-16 DIAGNOSIS — O34219 Maternal care for unspecified type scar from previous cesarean delivery: Secondary | ICD-10-CM

## 2017-01-16 DIAGNOSIS — Z369 Encounter for antenatal screening, unspecified: Secondary | ICD-10-CM

## 2017-01-16 DIAGNOSIS — Z0489 Encounter for examination and observation for other specified reasons: Secondary | ICD-10-CM

## 2017-01-16 DIAGNOSIS — O0990 Supervision of high risk pregnancy, unspecified, unspecified trimester: Secondary | ICD-10-CM

## 2017-01-16 DIAGNOSIS — IMO0002 Reserved for concepts with insufficient information to code with codable children: Secondary | ICD-10-CM

## 2017-01-16 DIAGNOSIS — Z363 Encounter for antenatal screening for malformations: Secondary | ICD-10-CM | POA: Insufficient documentation

## 2017-01-20 ENCOUNTER — Other Ambulatory Visit: Payer: Self-pay | Admitting: Certified Nurse Midwife

## 2017-01-20 DIAGNOSIS — O0990 Supervision of high risk pregnancy, unspecified, unspecified trimester: Secondary | ICD-10-CM

## 2017-01-29 ENCOUNTER — Ambulatory Visit (INDEPENDENT_AMBULATORY_CARE_PROVIDER_SITE_OTHER): Payer: Medicaid Other | Admitting: Obstetrics & Gynecology

## 2017-01-29 ENCOUNTER — Encounter: Payer: Self-pay | Admitting: Obstetrics & Gynecology

## 2017-01-29 DIAGNOSIS — O2342 Unspecified infection of urinary tract in pregnancy, second trimester: Secondary | ICD-10-CM | POA: Diagnosis not present

## 2017-01-29 DIAGNOSIS — O09522 Supervision of elderly multigravida, second trimester: Secondary | ICD-10-CM

## 2017-01-29 DIAGNOSIS — O99012 Anemia complicating pregnancy, second trimester: Secondary | ICD-10-CM

## 2017-01-29 DIAGNOSIS — O0992 Supervision of high risk pregnancy, unspecified, second trimester: Secondary | ICD-10-CM | POA: Diagnosis not present

## 2017-01-29 DIAGNOSIS — O0990 Supervision of high risk pregnancy, unspecified, unspecified trimester: Secondary | ICD-10-CM

## 2017-01-29 NOTE — Progress Notes (Signed)
Patient reports good fetal movement and mild uterine irritability.

## 2017-01-29 NOTE — Progress Notes (Signed)
   PRENATAL VISIT NOTE  Subjective:  Brianna Sampson is a 36 y.o. [id number] at [redacted]w[redacted]d being seen today for ongoing prenatal care.  She is currently monitored for the following issues for this high-risk pregnancy and has OBESITY; NICOTINE ADDICTION; DEPRESSION; ARTHRITIS, RIGHT KNEE; Supervision of high risk pregnancy, antepartum; AMA (advanced maternal age) multigravida 75+; Trichomoniasis; UTI (urinary tract infection) during pregnancy; Low vitamin D level; and Anemia affecting pregnancy in first trimester on her problem list.  Patient reports no complaints.  Contractions: Irritability. Vag. Bleeding: None.  Movement: Present. Denies leaking of fluid.   The following portions of the patient's history were reviewed and updated as appropriate: allergies, current medications, past family history, past medical history, past social history, past surgical history and problem list. Problem list updated.  Objective:   Vitals:   01/29/17 1421  BP: 121/85  Pulse: 79  Weight: 199 lb 3.2 oz (90.4 kg)    Fetal Status: Fetal Heart Rate (bpm): 140   Movement: Present     General:  Alert, oriented and cooperative. Patient is in no acute distress.  Skin: Skin is warm and dry. No rash noted.   Cardiovascular: Normal heart rate noted  Respiratory: Normal respiratory effort, no problems with respiration noted  Abdomen: Soft, gravid, appropriate for gestational age. Pain/Pressure: Absent     Pelvic:  Cervical exam deferred        Extremities: Normal range of motion.  Edema: None  Mental Status: Normal mood and affect. Normal behavior. Normal judgment and thought content.   Assessment and Plan:  Pregnancy: [id number] at [redacted]w[redacted]d  1. Supervision of high risk pregnancy, antepartum Normal fetal US  Preterm labor symptoms and general obstetric precautions including but not limited to vaginal bleeding, contractions, leaking of fluid and fetal movement were reviewed in detail with the patient. Please refer to  After Visit Summary for other counseling recommendations.  Return in about 4 weeks (around 02/26/2017).   Emeterio Reeve, MD

## 2017-01-29 NOTE — Patient Instructions (Signed)
Second Trimester of Pregnancy The second trimester is from week 13 through week 28, month 4 through 6. This is often the time in pregnancy that you feel your best. Often times, morning sickness has lessened or quit. You may have more energy, and you may get hungry more often. Your unborn baby (fetus) is growing rapidly. At the end of the sixth month, he or she is about 9 inches long and weighs about 1 pounds. You will likely feel the baby move (quickening) between 18 and 20 weeks of pregnancy. Follow these instructions at home:  Avoid all smoking, herbs, and alcohol. Avoid drugs not approved by your doctor.  Do not use any tobacco products, including cigarettes, chewing tobacco, and electronic cigarettes. If you need help quitting, ask your doctor. You may get counseling or other support to help you quit.  Only take medicine as told by your doctor. Some medicines are safe and some are not during pregnancy.  Exercise only as told by your doctor. Stop exercising if you start having cramps.  Eat regular, healthy meals.  Wear a good support bra if your breasts are tender.  Do not use hot tubs, steam rooms, or saunas.  Wear your seat belt when driving.  Avoid raw meat, uncooked cheese, and liter boxes and soil used by cats.  Take your prenatal vitamins.  Take 1500-2000 milligrams of calcium daily starting at the 20th week of pregnancy until you deliver your baby.  Try taking medicine that helps you poop (stool softener) as needed, and if your doctor approves. Eat more fiber by eating fresh fruit, vegetables, and whole grains. Drink enough fluids to keep your pee (urine) clear or pale yellow.  Take warm water baths (sitz baths) to soothe pain or discomfort caused by hemorrhoids. Use hemorrhoid cream if your doctor approves.  If you have puffy, bulging veins (varicose veins), wear support hose. Raise (elevate) your feet for 15 minutes, 3-4 times a day. Limit salt in your diet.  Avoid heavy  lifting, wear low heals, and sit up straight.  Rest with your legs raised if you have leg cramps or low back pain.  Visit your dentist if you have not gone during your pregnancy. Use a soft toothbrush to brush your teeth. Be gentle when you floss.  You can have sex (intercourse) unless your doctor tells you not to.  Go to your doctor visits. Get help if:  You feel dizzy.  You have mild cramps or pressure in your lower belly (abdomen).  You have a nagging pain in your belly area.  You continue to feel sick to your stomach (nauseous), throw up (vomit), or have watery poop (diarrhea).  You have bad smelling fluid coming from your vagina.  You have pain with peeing (urination). Get help right away if:  You have a fever.  You are leaking fluid from your vagina.  You have spotting or bleeding from your vagina.  You have severe belly cramping or pain.  You lose or gain weight rapidly.  You have trouble catching your breath and have chest pain.  You notice sudden or extreme puffiness (swelling) of your face, hands, ankles, feet, or legs.  You have not felt the baby move in over an hour.  You have severe headaches that do not go away with medicine.  You have vision changes. This information is not intended to replace advice given to you by your health care provider. Make sure you discuss any questions you have with your health care   provider. Document Released: 11/21/2009 Document Revised: 02/02/2016 Document Reviewed: 10/28/2012 Elsevier Interactive Patient Education  2017 [employer]  

## 2017-02-13 ENCOUNTER — Encounter (HOSPITAL_COMMUNITY): Payer: Self-pay

## 2017-02-13 ENCOUNTER — Ambulatory Visit (HOSPITAL_COMMUNITY)
Admission: RE | Admit: 2017-02-13 | Discharge: 2017-02-13 | Disposition: A | Discharge status: Home / Self Care | Payer: Medicaid Other | Source: Ambulatory Visit | Attending: Certified Nurse Midwife | Admitting: Certified Nurse Midwife

## 2017-02-13 DIAGNOSIS — O09522 Supervision of elderly multigravida, second trimester: Secondary | ICD-10-CM | POA: Insufficient documentation

## 2017-02-13 DIAGNOSIS — O99332 Smoking (tobacco) complicating pregnancy, second trimester: Secondary | ICD-10-CM | POA: Diagnosis not present

## 2017-02-13 DIAGNOSIS — O34219 Maternal care for unspecified type scar from previous cesarean delivery: Secondary | ICD-10-CM | POA: Diagnosis not present

## 2017-02-13 DIAGNOSIS — Z3A22 22 weeks gestation of pregnancy: Secondary | ICD-10-CM | POA: Diagnosis not present

## 2017-02-13 DIAGNOSIS — Z3689 Encounter for other specified antenatal screening: Secondary | ICD-10-CM | POA: Insufficient documentation

## 2017-02-13 DIAGNOSIS — IMO0002 Reserved for concepts with insufficient information to code with codable children: Secondary | ICD-10-CM

## 2017-02-13 DIAGNOSIS — Z0489 Encounter for examination and observation for other specified reasons: Secondary | ICD-10-CM

## 2017-02-13 NOTE — Addendum Note (Signed)
Encounter addended by: Hessie Dibble, RT on: 02/13/2017  3:11 PM<BR>    Actions taken: Imaging Exam ended

## 2017-02-26 ENCOUNTER — Ambulatory Visit (INDEPENDENT_AMBULATORY_CARE_PROVIDER_SITE_OTHER): Payer: Medicaid Other | Admitting: Obstetrics & Gynecology

## 2017-02-26 VITALS — BP 109/74 | HR 88 | Wt 196.5 lb

## 2017-02-26 DIAGNOSIS — O0992 Supervision of high risk pregnancy, unspecified, second trimester: Secondary | ICD-10-CM | POA: Diagnosis not present

## 2017-02-26 DIAGNOSIS — O09522 Supervision of elderly multigravida, second trimester: Secondary | ICD-10-CM | POA: Diagnosis not present

## 2017-02-26 DIAGNOSIS — O0990 Supervision of high risk pregnancy, unspecified, unspecified trimester: Secondary | ICD-10-CM

## 2017-02-26 NOTE — Progress Notes (Signed)
   PRENATAL VISIT NOTE  Subjective:  Brianna Sampson is a 36 y.o. [id number] at [redacted]w[redacted]d being seen today for ongoing prenatal care.  She is currently monitored for the following issues for this low-risk pregnancy and has OBESITY; NICOTINE ADDICTION; DEPRESSION; ARTHRITIS, RIGHT KNEE; Supervision of high risk pregnancy, antepartum; AMA (advanced maternal age) multigravida 42+; Trichomoniasis; UTI (urinary tract infection) during pregnancy; Low vitamin D level; and Anemia affecting pregnancy in first trimester on her problem list.  Patient reports pelvic pressure, normal movement.  Contractions: Not present. Vag. Bleeding: None.  Movement: (!) Decreased. Denies leaking of fluid.   The following portions of the patient's history were reviewed and updated as appropriate: allergies, current medications, past family history, past medical history, past social history, past surgical history and problem list. Problem list updated.  Objective:   Vitals:   02/26/17 1430  BP: 109/74  Pulse: 88  Weight: 196 lb 8 oz (89.1 kg)    Fetal Status:     Movement: (!) Decreased     General:  Alert, oriented and cooperative. Patient is in no acute distress.  Skin: Skin is warm and dry. No rash noted.   Cardiovascular: Normal heart rate noted  Respiratory: Normal respiratory effort, no problems with respiration noted  Abdomen: Soft, gravid, appropriate for gestational age. Pain/Pressure: Present     Pelvic:  Cervical exam deferred        Extremities: Normal range of motion.  Edema: None  Mental Status: Normal mood and affect. Normal behavior. Normal judgment and thought content.   Assessment and Plan:  Pregnancy: [id number] at [redacted]w[redacted]d  1. Supervision of high risk pregnancy, antepartum Try support belt for pressure sx  Preterm labor symptoms and general obstetric precautions including but not limited to vaginal bleeding, contractions, leaking of fluid and fetal movement were reviewed in detail with the  patient. Please refer to After Visit Summary for other counseling recommendations.  Return in about 4 weeks (around 03/26/2017).   Emeterio Reeve, MD

## 2017-02-26 NOTE — Patient Instructions (Signed)

## 2017-03-28 ENCOUNTER — Encounter: Payer: Self-pay | Admitting: Obstetrics & Gynecology

## 2017-03-28 ENCOUNTER — Other Ambulatory Visit (HOSPITAL_COMMUNITY)
Admission: RE | Admit: 2017-03-28 | Discharge: 2017-03-28 | Disposition: A | Discharge status: Home / Self Care | Payer: Medicaid Other | Source: Ambulatory Visit | Attending: Obstetrics & Gynecology | Admitting: Obstetrics & Gynecology

## 2017-03-28 ENCOUNTER — Ambulatory Visit (INDEPENDENT_AMBULATORY_CARE_PROVIDER_SITE_OTHER): Payer: Medicaid Other | Admitting: Obstetrics & Gynecology

## 2017-03-28 ENCOUNTER — Telehealth: Payer: Self-pay | Admitting: *Deleted

## 2017-03-28 VITALS — BP 115/81 | HR 89 | Wt 194.0 lb

## 2017-03-28 DIAGNOSIS — A599 Trichomoniasis, unspecified: Secondary | ICD-10-CM | POA: Diagnosis present

## 2017-03-28 DIAGNOSIS — O2340 Unspecified infection of urinary tract in pregnancy, unspecified trimester: Secondary | ICD-10-CM

## 2017-03-28 DIAGNOSIS — Z23 Encounter for immunization: Secondary | ICD-10-CM

## 2017-03-28 DIAGNOSIS — O0993 Supervision of high risk pregnancy, unspecified, third trimester: Secondary | ICD-10-CM

## 2017-03-28 DIAGNOSIS — O09529 Supervision of elderly multigravida, unspecified trimester: Secondary | ICD-10-CM

## 2017-03-28 DIAGNOSIS — O09523 Supervision of elderly multigravida, third trimester: Secondary | ICD-10-CM

## 2017-03-28 DIAGNOSIS — O2343 Unspecified infection of urinary tract in pregnancy, third trimester: Secondary | ICD-10-CM

## 2017-03-28 DIAGNOSIS — O0990 Supervision of high risk pregnancy, unspecified, unspecified trimester: Secondary | ICD-10-CM

## 2017-03-28 NOTE — Telephone Encounter (Signed)
Patient has a question and request a call back. 10:29 LM on VM to CB

## 2017-03-28 NOTE — Progress Notes (Signed)
   PRENATAL VISIT NOTE  Subjective:  Brianna Sampson is a 36 y.o. [phone] at [redacted]w[redacted]d being seen today for ongoing prenatal care.  She is currently monitored for the following issues for this high-risk pregnancy and has OBESITY; NICOTINE ADDICTION; DEPRESSION; Supervision of high risk pregnancy, antepartum; AMA (advanced maternal age) multigravida 35+; Trichomoniasis; and Low vitamin D level on her problem list.  Patient reports no complaints.  Contractions: Irregular. Vag. Bleeding: None.  Movement: Present. Denies leaking of fluid.   The following portions of the patient's history were reviewed and updated as appropriate: allergies, current medications, past family history, past medical history, past social history, past surgical history and problem list. Problem list updated.  Objective:   Vitals:   03/28/17 1611  BP: 115/81  Pulse: 89  Weight: 194 lb (88 kg)    Fetal Status: Fetal Heart Rate (bpm): 150 Fundal Height: 30 cm Movement: Present     General:  Alert, oriented and cooperative. Patient is in no acute distress.  Skin: Skin is warm and dry. No rash noted.   Cardiovascular: Normal heart rate noted  Respiratory: Normal respiratory effort, no problems with respiration noted  Abdomen: Soft, gravid, appropriate for gestational age.  Pain/Pressure: Present     Pelvic: Cervical exam deferred        Extremities: Normal range of motion.  Edema: None  Mental Status:  Normal mood and affect. Normal behavior. Normal judgment and thought content.   Assessment and Plan:  Pregnancy: [id number] at [redacted]w[redacted]d  1. Antepartum multigravida of advanced maternal age  56. Trichomoniasis Test of cure today, was positive in 11/2016. - Urine cytology ancillary only  3. Urinary tract infection in mother during pregnancy, antepartum Test of cure today, was positive in 11/2016. - Culture, OB Urine  4. Supervision of high risk pregnancy, antepartum Preterm labor symptoms and general obstetric precautions  including but not limited to vaginal bleeding, contractions, leaking of fluid and fetal movement were reviewed in detail with the patient. Please refer to After Visit Summary for other counseling recommendations.  Return in about 2 weeks (around 04/11/2017) for OB Visit. Will be scheduled for 2 hr GTT, labs tomorrow morning.Marland Kitchen   Verita Schneiders, MD

## 2017-03-28 NOTE — Patient Instructions (Addendum)
Return to clinic for any scheduled appointments or obstetric concerns, or go to [facility] for evaluation   Intrauterine Device Information An intrauterine device (IUD) is inserted into your uterus to prevent pregnancy. There are two types of IUDs available:  Copper IUD-This type of IUD is wrapped in copper wire and is placed inside the uterus. Copper makes the uterus and fallopian tubes produce a fluid that kills sperm. The copper IUD can stay in place for 10 years.  Hormone IUD-This type of IUD contains the hormone progestin (synthetic progesterone). The hormone thickens the cervical mucus and prevents sperm from entering the uterus. It also thins the uterine lining to prevent implantation of a fertilized egg. The hormone can weaken or kill the sperm that get into the uterus. One type of hormone IUD can stay in place for 5 years, and another type can stay in place for 3 years.  Your health care provider will make sure you are a good candidate for a contraceptive IUD. Discuss with your health care provider the possible side effects. Advantages of an intrauterine device  IUDs are highly effective, reversible, long acting, and low maintenance.  There are no estrogen-related side effects.  An IUD can be used when breastfeeding.  IUDs are not associated with weight gain.  The copper IUD works immediately after insertion.  The hormone IUD works right away if inserted within 7 days of your period starting. You will need to use a backup method of birth control for 7 days if the hormone IUD is inserted at any other time in your cycle.  The copper IUD does not interfere with your female hormones.  The hormone IUD can make heavy menstrual periods lighter and decrease cramping.  The hormone IUD can be used for 3 or 5 years.  The copper IUD can be used for 10 years. Disadvantages of an intrauterine device  The hormone IUD can be associated with irregular bleeding patterns.  The copper IUD can make  your menstrual flow heavier and more painful.  You may experience cramping and vaginal bleeding after insertion. This information is not intended to replace advice given to you by your health care provider. Make sure you discuss any questions you have with your health care provider. Document Released: 07/31/2004 Document Revised: 02/02/2016 Document Reviewed: 02/15/2013 Elsevier Interactive Patient Education  2017 [employer].

## 2017-03-29 ENCOUNTER — Other Ambulatory Visit: Payer: Medicaid Other

## 2017-03-29 DIAGNOSIS — O0990 Supervision of high risk pregnancy, unspecified, unspecified trimester: Secondary | ICD-10-CM

## 2017-03-30 LAB — CULTURE, OB URINE

## 2017-03-30 LAB — URINE CULTURE, OB REFLEX

## 2017-04-01 LAB — URINE CYTOLOGY ANCILLARY ONLY
Chlamydia: NEGATIVE
NEISSERIA GONORRHEA: NEGATIVE
TRICH (WINDOWPATH): NEGATIVE

## 2017-04-05 LAB — CBC
Hematocrit: 33.9 % — ABNORMAL LOW (ref 34.0–46.6)
Hemoglobin: 11.6 g/dL (ref 11.1–15.9)
MCH: 32.2 pg (ref 26.6–33.0)
MCHC: 34.2 g/dL (ref 31.5–35.7)
MCV: 94 fL (ref 79–97)
PLATELETS: 248 10*3/uL (ref 150–379)
RBC: 3.6 x10E6/uL — ABNORMAL LOW (ref 3.77–5.28)
RDW: 14.8 % (ref 12.3–15.4)
WBC: 9 10*3/uL (ref 3.4–10.8)

## 2017-04-05 LAB — GLUCOSE TOLERANCE, 2 HOURS W/ 1HR
Glucose, 1 hour: 124 mg/dL (ref 65–179)
Glucose, 2 hour: 105 mg/dL (ref 65–152)
Glucose, Fasting: 88 mg/dL (ref 65–91)

## 2017-04-05 LAB — RPR: RPR: NONREACTIVE

## 2017-04-05 LAB — HIV ANTIBODY (ROUTINE TESTING W REFLEX): HIV SCREEN 4TH GENERATION: NONREACTIVE

## 2017-04-05 NOTE — Telephone Encounter (Signed)
No response

## 2017-04-11 ENCOUNTER — Ambulatory Visit (INDEPENDENT_AMBULATORY_CARE_PROVIDER_SITE_OTHER): Payer: Medicaid Other | Admitting: Obstetrics and Gynecology

## 2017-04-11 VITALS — BP 134/92 | HR 103 | Wt 194.0 lb

## 2017-04-11 DIAGNOSIS — O0990 Supervision of high risk pregnancy, unspecified, unspecified trimester: Secondary | ICD-10-CM

## 2017-04-11 DIAGNOSIS — O09523 Supervision of elderly multigravida, third trimester: Secondary | ICD-10-CM

## 2017-04-11 DIAGNOSIS — O0993 Supervision of high risk pregnancy, unspecified, third trimester: Secondary | ICD-10-CM

## 2017-04-11 MED ORDER — PHENYLEPH-SHARK LIV OIL-MO-PET 0.25-3-14-71.9 % RE OINT: 1.0000 "application " | TOPICAL_OINTMENT | Freq: Two times a day (BID) | RECTAL | 0 refills | Status: DC | PRN

## 2017-04-11 MED ORDER — DOCUSATE SODIUM 100 MG PO CAPS: 100.0000 mg | ORAL_CAPSULE | Freq: Two times a day (BID) | ORAL | 2 refills | Status: DC | PRN

## 2017-04-11 NOTE — Progress Notes (Signed)
   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 [phone] at [redacted]w[redacted]d being seen today for ongoing prenatal care.  She is currently monitored for the following issues for this low-risk pregnancy and has OBESITY; NICOTINE ADDICTION; DEPRESSION; Supervision of high risk pregnancy, antepartum; AMA (advanced maternal age) multigravida 38+; Trichomoniasis; and Low vitamin D level on her problem list.  Patient reports constipation and hemorrhoids.  Contractions: Not present. Vag. Bleeding: None.  Movement: Present. Denies leaking of fluid.   The following portions of the patient's history were reviewed and updated as appropriate: allergies, current medications, past family history, past medical history, past social history, past surgical history and problem list. Problem list updated.  Objective:   Vitals:   04/11/17 1129  BP: (!) 134/92  Pulse: (!) 103  Weight: 194 lb (88 kg)    Fetal Status: Fetal Heart Rate (bpm): 147 Fundal Height: 32 cm Movement: Present     General:  Alert, oriented and cooperative. Patient is in no acute distress.  Skin: Skin is warm and dry. No rash noted.   Cardiovascular: Normal heart rate noted  Respiratory: Normal respiratory effort, no problems with respiration noted  Abdomen: Soft, gravid, appropriate for gestational age.  Pain/Pressure: Absent     Pelvic: Cervical exam deferred        Extremities: Normal range of motion.  Edema: None  Mental Status:  Normal mood and affect. Normal behavior. Normal judgment and thought content.   Assessment and Plan:  Pregnancy: [id number] at 107w3d  1. Supervision of high risk pregnancy, antepartum Patient is doing well Advised to consume more vegetables and increase water intake to combat constipation Rx colace and preparation H provided Third trimester labs results reviewed Patient remain undecided on contraception but is considering Nexplanon vs Paraguard  2. Elderly multigravida in third  trimester   Preterm labor symptoms and general obstetric precautions including but not limited to vaginal bleeding, contractions, leaking of fluid and fetal movement were reviewed in detail with the patient. Please refer to After Visit Summary for other counseling recommendations.  Return in about 2 weeks (around 04/25/2017) for [place].   Mora Bellman, MD

## 2017-04-11 NOTE — Progress Notes (Signed)
Patient would like Rx for hemorriods

## 2017-04-22 ENCOUNTER — Ambulatory Visit (INDEPENDENT_AMBULATORY_CARE_PROVIDER_SITE_OTHER): Payer: Medicaid Other | Admitting: Obstetrics and Gynecology

## 2017-04-22 VITALS — BP 121/75 | HR 95 | Wt 199.0 lb

## 2017-04-22 DIAGNOSIS — O09523 Supervision of elderly multigravida, third trimester: Secondary | ICD-10-CM

## 2017-04-22 DIAGNOSIS — O0993 Supervision of high risk pregnancy, unspecified, third trimester: Secondary | ICD-10-CM

## 2017-04-22 DIAGNOSIS — O0990 Supervision of high risk pregnancy, unspecified, unspecified trimester: Secondary | ICD-10-CM

## 2017-04-22 NOTE — Progress Notes (Signed)
   PRENATAL VISIT NOTE  Subjective:  Brianna Sampson is a 36 y.o. [phone] at [redacted]w[redacted]d being seen today for ongoing prenatal care.  She is currently monitored for the following issues for this low-risk pregnancy and has OBESITY; NICOTINE ADDICTION; DEPRESSION; Supervision of high risk pregnancy, antepartum; AMA (advanced maternal age) multigravida 35+; Trichomoniasis; and Low vitamin D level on her problem list.  Patient reports no complaints.  Contractions: Not present. Vag. Bleeding: None.  Movement: Present. Denies leaking of fluid.   The following portions of the patient's history were reviewed and updated as appropriate: allergies, current medications, past family history, past medical history, past social history, past surgical history and problem list. Problem list updated.  Objective:   Vitals:   04/22/17 1628  BP: 121/75  Pulse: 95  Weight: 199 lb (90.3 kg)    Fetal Status: Fetal Heart Rate (bpm): 149 (Simultaneous filing. User may not have seen previous data.) Fundal Height: 32 cm Movement: Present     General:  Alert, oriented and cooperative. Patient is in no acute distress.  Skin: Skin is warm and dry. No rash noted.   Cardiovascular: Normal heart rate noted  Respiratory: Normal respiratory effort, no problems with respiration noted  Abdomen: Soft, gravid, appropriate for gestational age.  Pain/Pressure: Absent     Pelvic: Cervical exam deferred        Extremities: Normal range of motion.  Edema: None  Mental Status:  Normal mood and affect. Normal behavior. Normal judgment and thought content.   Assessment and Plan:  Pregnancy: [id number] at [redacted]w[redacted]d  1. Supervision of high risk pregnancy, antepartum Patient is doing well without complaints She reports improvement in her constipation and hemorrhoid discomfort  2. Elderly multigravida in third trimester Normal NIPS  Preterm labor symptoms and general obstetric precautions including but not limited to vaginal bleeding,  contractions, leaking of fluid and fetal movement were reviewed in detail with the patient. Please refer to After Visit Summary for other counseling recommendations.  Return in about 2 weeks (around 05/06/2017) for [place].   Mora Bellman, MD

## 2017-05-06 ENCOUNTER — Ambulatory Visit (INDEPENDENT_AMBULATORY_CARE_PROVIDER_SITE_OTHER): Payer: Medicaid Other | Admitting: Obstetrics and Gynecology

## 2017-05-06 VITALS — BP 125/90 | HR 97 | Wt 196.4 lb

## 2017-05-06 DIAGNOSIS — O09523 Supervision of elderly multigravida, third trimester: Secondary | ICD-10-CM

## 2017-05-06 DIAGNOSIS — O0990 Supervision of high risk pregnancy, unspecified, unspecified trimester: Secondary | ICD-10-CM

## 2017-05-06 DIAGNOSIS — Z98891 History of uterine scar from previous surgery: Secondary | ICD-10-CM | POA: Insufficient documentation

## 2017-05-06 NOTE — Patient Instructions (Signed)
Third Trimester of Pregnancy The third trimester is from week 28 through week 40 (months 7 through 9). The third trimester is a time when the unborn baby (fetus) is growing rapidly. At the end of the ninth month, the fetus is about 20 inches in length and weighs 6-10 pounds. Body changes during your third trimester Your body will continue to go through many changes during pregnancy. The changes vary from woman to woman. During the third trimester:  Your weight will continue to increase. You can expect to gain 25-35 pounds (11-16 kg) by the end of the pregnancy.  You may begin to get stretch marks on your hips, abdomen, and breasts.  You may urinate more often because the fetus is moving lower into your pelvis and pressing on your bladder.  You may develop or continue to have heartburn. This is caused by increased hormones that slow down muscles in the digestive tract.  You may develop or continue to have constipation because increased hormones slow digestion and cause the muscles that push waste through your intestines to relax.  You may develop hemorrhoids. These are swollen veins (varicose veins) in the rectum that can itch or be painful.  You may develop swollen, bulging veins (varicose veins) in your legs.  You may have increased body aches in the pelvis, back, or thighs. This is due to weight gain and increased hormones that are relaxing your joints.  You may have changes in your hair. These can include thickening of your hair, rapid growth, and changes in texture. Some women also have hair loss during or after pregnancy, or hair that feels dry or thin. Your hair will most likely return to normal after your baby is born.  Your breasts will continue to grow and they will continue to become tender. A yellow fluid (colostrum) may leak from your breasts. This is the first milk you are producing for your baby.  Your belly button may stick out.  You may notice more swelling in your hands,  face, or ankles.  You may have increased tingling or numbness in your hands, arms, and legs. The skin on your belly may also feel numb.  You may feel short of breath because of your expanding uterus.  You may have more problems sleeping. This can be caused by the size of your belly, increased need to urinate, and an increase in your body's metabolism.  You may notice the fetus "dropping," or moving lower in your abdomen (lightening).  You may have increased vaginal discharge.  You may notice your joints feel loose and you may have pain around your pelvic bone.  What to expect at prenatal visits You will have prenatal exams every 2 weeks until week 36. Then you will have weekly prenatal exams. During a routine prenatal visit:  You will be weighed to make sure you and the baby are growing normally.  Your blood pressure will be taken.  Your abdomen will be measured to track your baby's growth.  The fetal heartbeat will be listened to.  Any test results from the previous visit will be discussed.  You may have a cervical check near your due date to see if your cervix has softened or thinned (effaced).  You will be tested for Group B streptococcus. This happens between 35 and 37 weeks.  Your health care provider may ask you:  What your birth plan is.  How you are feeling.  If you are feeling the baby move.  If you have had   any abnormal symptoms, such as leaking fluid, bleeding, severe headaches, or abdominal cramping.  If you are using any tobacco products, including cigarettes, chewing tobacco, and electronic cigarettes.  If you have any questions.  Other tests or screenings that may be performed during your third trimester include:  Blood tests that check for low iron levels (anemia).  Fetal testing to check the health, activity level, and growth of the fetus. Testing is done if you have certain medical conditions or if there are problems during the  pregnancy.  Nonstress test (NST). This test checks the health of your baby to make sure there are no signs of problems, such as the baby not getting enough oxygen. During this test, a belt is placed around your belly. The baby is made to move, and its heart rate is monitored during movement.  What is false labor? False labor is a condition in which you feel small, irregular tightenings of the muscles in the womb (contractions) that usually go away with rest, changing position, or drinking water. These are called Braxton Hicks contractions. Contractions may last for hours, days, or even weeks before true labor sets in. If contractions come at regular intervals, become more frequent, increase in intensity, or become painful, you should see your health care provider. What are the signs of labor?  Abdominal cramps.  Regular contractions that start at 10 minutes apart and become stronger and more frequent with time.  Contractions that start on the top of the uterus and spread down to the lower abdomen and back.  Increased pelvic pressure and dull back pain.  A watery or bloody mucus discharge that comes from the vagina.  Leaking of amniotic fluid. This is also known as your "water breaking." It could be a slow trickle or a gush. Let your health care provider know if it has a color or strange odor. If you have any of these signs, call your health care provider right away, even if it is before your due date. Follow these instructions at home: Medicines  Follow your health care provider's instructions regarding medicine use. Specific medicines may be either safe or unsafe to take during pregnancy.  Take a prenatal vitamin that contains at least 600 micrograms (mcg) of folic acid.  If you develop constipation, try taking a stool softener if your health care provider approves. Eating and drinking  Eat a balanced diet that includes fresh fruits and vegetables, whole grains, good sources of protein  such as meat, eggs, or tofu, and low-fat dairy. Your health care provider will help you determine the amount of weight gain that is right for you.  Avoid raw meat and uncooked cheese. These carry germs that can cause birth defects in the baby.  If you have low calcium intake from food, talk to your health care provider about whether you should take a daily calcium supplement.  Eat four or five small meals rather than three large meals a day.  Limit foods that are high in fat and processed sugars, such as fried and sweet foods.  To prevent constipation: ? Drink enough fluid to keep your urine clear or pale yellow. ? Eat foods that are high in fiber, such as fresh fruits and vegetables, whole grains, and beans. Activity  Exercise only as directed by your health care provider. Most women can continue their usual exercise routine during pregnancy. Try to exercise for 30 minutes at least 5 days a week. Stop exercising if you experience uterine contractions.  Avoid heavy   lifting.  Do not exercise in extreme heat or humidity, or at high altitudes.  Wear low-heel, comfortable shoes.  Practice good posture.  You may continue to have sex unless your health care provider tells you otherwise. Relieving pain and discomfort  Take frequent breaks and rest with your legs elevated if you have leg cramps or low back pain.  Take warm sitz baths to soothe any pain or discomfort caused by hemorrhoids. Use hemorrhoid cream if your health care provider approves.  Wear a good support bra to prevent discomfort from breast tenderness.  If you develop varicose veins: ? Wear support pantyhose or compression stockings as told by your healthcare provider. ? Elevate your feet for 15 minutes, 3-4 times a day. Prenatal care  Write down your questions. Take them to your prenatal visits.  Keep all your prenatal visits as told by your health care provider. This is important. Safety  Wear your seat belt at  all times when driving.  Make a list of emergency phone numbers, including numbers for family, friends, the hospital, and police and fire departments. General instructions  Avoid cat litter boxes and soil used by cats. These carry germs that can cause birth defects in the baby. If you have a cat, ask someone to clean the litter box for you.  Do not travel far distances unless it is absolutely necessary and only with the approval of your health care provider.  Do not use hot tubs, steam rooms, or saunas.  Do not drink alcohol.  Do not use any products that contain nicotine or tobacco, such as cigarettes and e-cigarettes. If you need help quitting, ask your health care provider.  Do not use any medicinal herbs or unprescribed drugs. These chemicals affect the formation and growth of the baby.  Do not douche or use tampons or scented sanitary pads.  Do not cross your legs for long periods of time.  To prepare for the arrival of your baby: ? Take prenatal classes to understand, practice, and ask questions about labor and delivery. ? Make a trial run to the hospital. ? Visit the hospital and tour the maternity area. ? Arrange for maternity or paternity leave through employers. ? Arrange for family and friends to take care of pets while you are in the hospital. ? Purchase a rear-facing car seat and make sure you know how to install it in your car. ? Pack your hospital bag. ? Prepare the baby's nursery. Make sure to remove all pillows and stuffed animals from the baby's crib to prevent suffocation.  Visit your dentist if you have not gone during your pregnancy. Use a soft toothbrush to brush your teeth and be gentle when you floss. Contact a health care provider if:  You are unsure if you are in labor or if your water has broken.  You become dizzy.  You have mild pelvic cramps, pelvic pressure, or nagging pain in your abdominal area.  You have lower back pain.  You have persistent  nausea, vomiting, or diarrhea.  You have an unusual or bad smelling vaginal discharge.  You have pain when you urinate. Get help right away if:  Your water breaks before 37 weeks.  You have regular contractions less than 5 minutes apart before 37 weeks.  You have a fever.  You are leaking fluid from your vagina.  You have spotting or bleeding from your vagina.  You have severe abdominal pain or cramping.  You have rapid weight loss or weight gain.    You have shortness of breath with chest pain.  You notice sudden or extreme swelling of your face, hands, ankles, feet, or legs.  Your baby makes fewer than 10 movements in 2 hours.  You have severe headaches that do not go away when you take medicine.  You have vision changes. Summary  The third trimester is from week 28 through week 40, months 7 through 9. The third trimester is a time when the unborn baby (fetus) is growing rapidly.  During the third trimester, your discomfort may increase as you and your baby continue to gain weight. You may have abdominal, leg, and back pain, sleeping problems, and an increased need to urinate.  During the third trimester your breasts will keep growing and they will continue to become tender. A yellow fluid (colostrum) may leak from your breasts. This is the first milk you are producing for your baby.  False labor is a condition in which you feel small, irregular tightenings of the muscles in the womb (contractions) that eventually go away. These are called Braxton Hicks contractions. Contractions may last for hours, days, or even weeks before true labor sets in.  Signs of labor can include: abdominal cramps; regular contractions that start at 10 minutes apart and become stronger and more frequent with time; watery or bloody mucus discharge that comes from the vagina; increased pelvic pressure and dull back pain; and leaking of amniotic fluid. This information is not intended to replace advice  given to you by your health care provider. Make sure you discuss any questions you have with your health care provider. Document Released: 08/21/2001 Document Revised: 02/02/2016 Document Reviewed: 10/28/2012 Elsevier Interactive Patient Education  2017 [employer]  

## 2017-05-06 NOTE — Progress Notes (Signed)
Subjective:  Brianna Sampson is a 36 y.o. [id number] at [redacted]w[redacted]d being seen today for ongoing prenatal care.  She is currently monitored for the following issues for this high-risk pregnancy and has OBESITY; NICOTINE ADDICTION; DEPRESSION; Supervision of high risk pregnancy, antepartum; AMA (advanced maternal age) multigravida 35+; Low vitamin D level; and History of C-section on her problem list.  Patient reports no complaints.  Contractions: Not present. Vag. Bleeding: None.  Movement: Present. Denies leaking of fluid.   The following portions of the patient's history were reviewed and updated as appropriate: allergies, current medications, past family history, past medical history, past social history, past surgical history and problem list. Problem list updated.  Objective:   Vitals:   05/06/17 1628  BP: 125/90  Pulse: 97  Weight: 196 lb 6.4 oz (89.1 kg)    Fetal Status: Fetal Heart Rate (bpm): 141   Movement: Present     General:  Alert, oriented and cooperative. Patient is in no acute distress.  Skin: Skin is warm and dry. No rash noted.   Cardiovascular: Normal heart rate noted  Respiratory: Normal respiratory effort, no problems with respiration noted  Abdomen: Soft, gravid, appropriate for gestational age. Pain/Pressure: Absent     Pelvic:  Cervical exam deferred        Extremities: Normal range of motion.  Edema: None  Mental Status: Normal mood and affect. Normal behavior. Normal judgment and thought content.   Urinalysis:      Assessment and Plan:  Pregnancy: [id number] at [redacted]w[redacted]d  1. Supervision of high risk pregnancy, antepartum Stable  GBS next visit  2. Elderly multigravida in third trimester Nl NIPS  3. History of C-section TOLAC paper signed Successful VBAC in the past  Preterm labor symptoms and general obstetric precautions including but not limited to vaginal bleeding, contractions, leaking of fluid and fetal movement were reviewed in detail with the  patient. Please refer to After Visit Summary for other counseling recommendations.  Return in about 2 weeks (around 05/20/2017) for OB visit.   Chancy Milroy, MD

## 2017-05-19 ENCOUNTER — Encounter (HOSPITAL_COMMUNITY): Payer: Self-pay | Admitting: *Deleted

## 2017-05-19 ENCOUNTER — Inpatient Hospital Stay (HOSPITAL_COMMUNITY)
Admission: AD | Admit: 2017-05-19 | Discharge: 2017-05-19 | Disposition: A | Discharge status: Home / Self Care | Payer: Medicaid Other | Source: Ambulatory Visit | Attending: Obstetrics & Gynecology | Admitting: Obstetrics & Gynecology

## 2017-05-19 DIAGNOSIS — O479 False labor, unspecified: Secondary | ICD-10-CM | POA: Diagnosis not present

## 2017-05-19 DIAGNOSIS — Z3A35 35 weeks gestation of pregnancy: Secondary | ICD-10-CM | POA: Insufficient documentation

## 2017-05-19 DIAGNOSIS — O99333 Smoking (tobacco) complicating pregnancy, third trimester: Secondary | ICD-10-CM | POA: Diagnosis not present

## 2017-05-19 DIAGNOSIS — F17210 Nicotine dependence, cigarettes, uncomplicated: Secondary | ICD-10-CM | POA: Insufficient documentation

## 2017-05-19 DIAGNOSIS — O4703 False labor before 37 completed weeks of gestation, third trimester: Secondary | ICD-10-CM | POA: Diagnosis not present

## 2017-05-19 DIAGNOSIS — R109 Unspecified abdominal pain: Secondary | ICD-10-CM | POA: Diagnosis present

## 2017-05-19 LAB — URINALYSIS, ROUTINE W REFLEX MICROSCOPIC
BILIRUBIN URINE: NEGATIVE
GLUCOSE, UA: NEGATIVE mg/dL
HGB URINE DIPSTICK: NEGATIVE
Ketones, ur: 80 mg/dL — AB
NITRITE: NEGATIVE
PROTEIN: NEGATIVE mg/dL
Specific Gravity, Urine: 1.014 (ref 1.005–1.030)
pH: 6 (ref 5.0–8.0)

## 2017-05-19 NOTE — Progress Notes (Signed)
Noni Saupe NP on unit and aware of pt's admission and status. RN to do sve and NP will see pt

## 2017-05-19 NOTE — Progress Notes (Signed)
Baby active 

## 2017-05-19 NOTE — Progress Notes (Signed)
Written and verbal d/c instructions given and understanding voiced. 

## 2017-05-19 NOTE — MAU Provider Note (Signed)
History     CSN: [id number]  Arrival date and time: 05/19/17 4132   First Provider Initiated Contact with Patient 05/19/17 [phone]      Chief Complaint  Patient presents with  . Abdominal Cramping   HPI   Ms.Brianna Sampson is a 36 y.o. female [phone] @ [redacted]w[redacted]d here in MAU with contractions. States the contractions started last night around 10 pm and have kept her up all night long. She denies vaginal bleeding. + fetal movement.   OB History    Gravida Para Term Preterm AB Living   8 4 4   3 4    SAB TAB Ectopic Multiple Live Births   1 2     4       Past Medical History:  Diagnosis Date  . Anxiety   . ARTHRITIS, RIGHT KNEE 08/16/2008   Qualifier: Diagnosis of  By: Moshe Cipro MD, Joycelyn Schmid    . Depression     Past Surgical History:  Procedure Laterality Date  . CESAREAN SECTION    . CHOLECYSTECTOMY  2002    Family History  Problem Relation Age of Onset  . Hypertension Mother   . Diabetes Mother   . Heart disease Mother     Social History  Substance Use Topics  . Smoking status: Current Every Day Smoker    Packs/day: 0.25    Types: Cigarettes  . Smokeless tobacco: Never Used  . Alcohol use No    Allergies: No Known Allergies  Prescriptions Prior to Admission  Medication Sig Dispense Refill Last Dose  . acetaminophen (TYLENOL) 500 MG tablet Take 1,000 mg by mouth every 6 (six) hours as needed for mild pain or headache.   05/18/2017 at Unknown time  . phenylephrine-shark liver oil-mineral oil-petrolatum (PREPARATION H) 0.25-3-14-71.9 % rectal ointment Place 1 application rectally 2 (two) times daily as needed for hemorrhoids. 30 g 0 Past Month at Unknown time  . Prenatal-DSS-FeCb-FeGl-FA (CITRANATAL BLOOM) 90-1 MG TABS   1 05/18/2017 at Unknown time  . Vitamin D, Ergocalciferol, (DRISDOL) 50000 units CAPS capsule Take 1 capsule (50,000 Units total) by mouth every 7 (seven) days. 30 capsule 2 Past Week at Unknown time  . docusate sodium (COLACE) 100 MG capsule Take 1  capsule (100 mg total) by mouth 2 (two) times daily as needed. (Patient not taking: Reported on 05/06/2017) 30 capsule 2 Not Taking   Results for orders placed or performed during the hospital encounter of 05/19/17 (from the past 48 hour(s))  Urinalysis, Routine w reflex microscopic     Status: Abnormal   Collection Time: 05/19/17  4:50 AM  Result Value Ref Range   Color, Urine YELLOW YELLOW   APPearance CLEAR CLEAR   Specific Gravity, Urine 1.014 1.005 - 1.030   pH 6.0 5.0 - 8.0   Glucose, UA NEGATIVE NEGATIVE mg/dL   Hgb urine dipstick NEGATIVE NEGATIVE   Bilirubin Urine NEGATIVE NEGATIVE   Ketones, ur 80 (A) NEGATIVE mg/dL   Protein, ur NEGATIVE NEGATIVE mg/dL   Nitrite NEGATIVE NEGATIVE   Leukocytes, UA SMALL (A) NEGATIVE   RBC / HPF 0-5 0 - 5 RBC/hpf   WBC, UA 0-5 0 - 5 WBC/hpf   Bacteria, UA RARE (A) NONE SEEN   Squamous Epithelial / LPF 0-5 (A) NONE SEEN   Mucus PRESENT    Review of Systems  Gastrointestinal: Positive for abdominal pain.  Genitourinary: Negative for dysuria.   Physical Exam   Blood pressure 124/66, pulse 83, temperature 98.2 F (36.8 C), resp. rate 18,  height 5' (1.524 m), weight 85.7 kg (189 lb), last menstrual period 09/10/2016, unknown if currently breastfeeding.  Physical Exam  Constitutional: She is oriented to person, place, and time. She appears well-developed and well-nourished.  Non-toxic appearance. She does not have a sickly appearance. She does not appear ill. No distress.  Genitourinary:  Genitourinary Comments: Dilation: Fingertip Effacement (%): Thick Cervical Position: Anterior Station: Ballotable Presentation: Undeterminable Exam by:: Blima Singer RNC  Musculoskeletal: Normal range of motion.  Neurological: She is alert and oriented to person, place, and time.  Skin: Skin is warm. She is not diaphoretic.  Psychiatric: Her behavior is normal.   Fetal Tracing: Baseline: 145 bpm Variability: moderate  Accelerations:  15x15 Decelerations: None Toco: Q2-5  MAU Course  Procedures  None  MDM  UA shows > 80 of ketones  Oral hydration encouraged in MAU   Assessment and Plan   A:  1. False labor     P:  Discharge home in stable condition Strict return precautions Preterm labor precautions  Jamye Balicki, Artist Pais, NP 05/19/2017 11:24 AM

## 2017-05-19 NOTE — MAU Note (Signed)
Having braxton hicks contractions since Sat evening. Denies bleeding or LOF.

## 2017-05-19 NOTE — Discharge Instructions (Signed)

## 2017-05-20 ENCOUNTER — Ambulatory Visit (INDEPENDENT_AMBULATORY_CARE_PROVIDER_SITE_OTHER): Payer: Medicaid Other | Admitting: Obstetrics and Gynecology

## 2017-05-20 ENCOUNTER — Other Ambulatory Visit (HOSPITAL_COMMUNITY)
Admission: RE | Admit: 2017-05-20 | Discharge: 2017-05-20 | Disposition: A | Discharge status: Home / Self Care | Payer: Medicaid Other | Source: Ambulatory Visit | Attending: Obstetrics and Gynecology | Admitting: Obstetrics and Gynecology

## 2017-05-20 VITALS — BP 122/83 | HR 94 | Temp 98.5°F | Wt 185.8 lb

## 2017-05-20 DIAGNOSIS — O0993 Supervision of high risk pregnancy, unspecified, third trimester: Secondary | ICD-10-CM

## 2017-05-20 DIAGNOSIS — O0990 Supervision of high risk pregnancy, unspecified, unspecified trimester: Secondary | ICD-10-CM | POA: Diagnosis present

## 2017-05-20 DIAGNOSIS — O09523 Supervision of elderly multigravida, third trimester: Secondary | ICD-10-CM

## 2017-05-20 DIAGNOSIS — Z3A36 36 weeks gestation of pregnancy: Secondary | ICD-10-CM | POA: Diagnosis not present

## 2017-05-20 DIAGNOSIS — Z98891 History of uterine scar from previous surgery: Secondary | ICD-10-CM

## 2017-05-20 DIAGNOSIS — O34219 Maternal care for unspecified type scar from previous cesarean delivery: Secondary | ICD-10-CM

## 2017-05-20 LAB — OB RESULTS CONSOLE GC/CHLAMYDIA: GC PROBE AMP, GENITAL: NEGATIVE

## 2017-05-20 NOTE — Progress Notes (Signed)
   PRENATAL VISIT NOTE  Subjective:  Brianna Sampson is a 36 y.o. [phone] at [redacted]w[redacted]d being seen today for ongoing prenatal care.  She is currently monitored for the following issues for this high-risk pregnancy and has OBESITY; NICOTINE ADDICTION; DEPRESSION; Supervision of high risk pregnancy, antepartum; AMA (advanced maternal age) multigravida 35+; Low vitamin D level; and History of C-section on her problem list.  Patient reports no complaints.  Contractions: Irregular. Vag. Bleeding: None.  Movement: Present. Denies leaking of fluid.   The following portions of the patient's history were reviewed and updated as appropriate: allergies, current medications, past family history, past medical history, past social history, past surgical history and problem list. Problem list updated.  Objective:   Vitals:   05/20/17 1634  BP: 122/83  Pulse: 94  Temp: 98.5 F (36.9 C)  Weight: 185 lb 12.8 oz (84.3 kg)    Fetal Status: Fetal Heart Rate (bpm): 136 Fundal Height: 35 cm Movement: Present  Presentation: Vertex  General:  Alert, oriented and cooperative. Patient is in no acute distress.  Skin: Skin is warm and dry. No rash noted.   Cardiovascular: Normal heart rate noted  Respiratory: Normal respiratory effort, no problems with respiration noted  Abdomen: Soft, gravid, appropriate for gestational age.  Pain/Pressure: Present     Pelvic: Cervical exam performed Dilation: Fingertip Effacement (%): Thick Station: Ballotable  Extremities: Normal range of motion.  Edema: None  Mental Status:  Normal mood and affect. Normal behavior. Normal judgment and thought content.   Assessment and Plan:  Pregnancy: [id number] at 109w0d  1. History of C-section Patient desires TOLAC  2. Supervision of high risk pregnancy, antepartum Patient is doing well without complaints Cultures today - Strep Gp B NAA - Cervicovaginal ancillary only  3. Elderly multigravida in third trimester Low risk  NIPS  Preterm labor symptoms and general obstetric precautions including but not limited to vaginal bleeding, contractions, leaking of fluid and fetal movement were reviewed in detail with the patient. Please refer to After Visit Summary for other counseling recommendations.  Return in about 1 week (around 05/27/2017) for ROB.   Mora Bellman, MD

## 2017-05-20 NOTE — Progress Notes (Signed)
Pt c/o runny nose, coughing, and HA's.

## 2017-05-21 LAB — CERVICOVAGINAL ANCILLARY ONLY
Chlamydia: NEGATIVE
Neisseria Gonorrhea: NEGATIVE

## 2017-05-22 LAB — STREP GP B NAA: Strep Gp B NAA: NEGATIVE

## 2017-05-27 ENCOUNTER — Ambulatory Visit (INDEPENDENT_AMBULATORY_CARE_PROVIDER_SITE_OTHER): Payer: Medicaid Other | Admitting: Obstetrics

## 2017-05-27 ENCOUNTER — Encounter: Payer: Self-pay | Admitting: Obstetrics

## 2017-05-27 VITALS — BP 120/83 | HR 97 | Wt 188.6 lb

## 2017-05-27 DIAGNOSIS — O26893 Other specified pregnancy related conditions, third trimester: Secondary | ICD-10-CM

## 2017-05-27 DIAGNOSIS — O34219 Maternal care for unspecified type scar from previous cesarean delivery: Secondary | ICD-10-CM

## 2017-05-27 DIAGNOSIS — Z98891 History of uterine scar from previous surgery: Secondary | ICD-10-CM

## 2017-05-27 DIAGNOSIS — O0993 Supervision of high risk pregnancy, unspecified, third trimester: Secondary | ICD-10-CM

## 2017-05-27 DIAGNOSIS — K2100 Gastro-esophageal reflux disease with esophagitis, without bleeding: Secondary | ICD-10-CM

## 2017-05-27 DIAGNOSIS — O0990 Supervision of high risk pregnancy, unspecified, unspecified trimester: Secondary | ICD-10-CM

## 2017-05-27 DIAGNOSIS — R12 Heartburn: Secondary | ICD-10-CM

## 2017-05-27 DIAGNOSIS — O09523 Supervision of elderly multigravida, third trimester: Secondary | ICD-10-CM

## 2017-05-27 MED ORDER — LEVONORGESTREL 19.5 MG IU IUD: INTRAUTERINE_SYSTEM | INTRAUTERINE | Status: DC

## 2017-05-27 MED ORDER — OMEPRAZOLE 20 MG PO CPDR: 20.0000 mg | DELAYED_RELEASE_CAPSULE | Freq: Two times a day (BID) | ORAL | 5 refills | Status: DC

## 2017-05-27 NOTE — Addendum Note (Signed)
Addended by: Baltazar Najjar A on: 05/27/2017 04:17 PM   Modules accepted: Orders

## 2017-05-27 NOTE — Addendum Note (Signed)
Addended by: Maryruth Eve on: 05/27/2017 05:08 PM   Modules accepted: Orders

## 2017-05-27 NOTE — Progress Notes (Signed)
Subjective:  Brianna Sampson is a 36 y.o. [id number] at [redacted]w[redacted]d being seen today for ongoing prenatal care.  She is currently monitored for the following issues for this high-risk pregnancy and has OBESITY; NICOTINE ADDICTION; DEPRESSION; Supervision of high risk pregnancy, antepartum; AMA (advanced maternal age) multigravida 35+; Low vitamin D level; and History of C-section on her problem list.  Patient reports no complaints.  Contractions: Not present. Vag. Bleeding: None.  Movement: Present. Denies leaking of fluid.   The following portions of the patient's history were reviewed and updated as appropriate: allergies, current medications, past family history, past medical history, past social history, past surgical history and problem list. Problem list updated.  Objective:   Vitals:   05/27/17 1530  BP: 120/83  Pulse: 97  Weight: 188 lb 9.6 oz (85.5 kg)    Fetal Status: Fetal Heart Rate (bpm): 140   Movement: Present     General:  Alert, oriented and cooperative. Patient is in no acute distress.  Skin: Skin is warm and dry. No rash noted.   Cardiovascular: Normal heart rate noted  Respiratory: Normal respiratory effort, no problems with respiration noted  Abdomen: Soft, gravid, appropriate for gestational age. Pain/Pressure: Present     Pelvic:  Cervical exam deferred        Extremities: Normal range of motion.  Edema: None  Mental Status: Normal mood and affect. Normal behavior. Normal judgment and thought content.   Urinalysis:      Assessment and Plan:  Pregnancy: [id number] at [redacted]w[redacted]d  1. Supervision of high risk pregnancy, antepartum   2. History of C-section   3. Elderly multigravida in third trimester   4. Hx successful VBAC (vaginal birth after cesarean), currently pregnant   5. Desires VBAC (vaginal birth after cesarean) trial - VBAC consent signed  Term labor symptoms and general obstetric precautions including but not limited to vaginal bleeding, contractions,  leaking of fluid and fetal movement were reviewed in detail with the patient. Please refer to After Visit Summary for other counseling recommendations.  Return in about 1 week (around 06/03/2017) for ROB.   Shelly Bombard, MD

## 2017-05-27 NOTE — Progress Notes (Signed)
Patient reports good fetal movement with little pressure, denies contractions.

## 2017-06-03 ENCOUNTER — Ambulatory Visit (INDEPENDENT_AMBULATORY_CARE_PROVIDER_SITE_OTHER): Payer: Medicaid Other | Admitting: Obstetrics and Gynecology

## 2017-06-03 VITALS — BP 130/87 | HR 93 | Wt 189.0 lb

## 2017-06-03 DIAGNOSIS — O0990 Supervision of high risk pregnancy, unspecified, unspecified trimester: Secondary | ICD-10-CM

## 2017-06-03 DIAGNOSIS — O0993 Supervision of high risk pregnancy, unspecified, third trimester: Secondary | ICD-10-CM

## 2017-06-03 DIAGNOSIS — Z98891 History of uterine scar from previous surgery: Secondary | ICD-10-CM

## 2017-06-03 DIAGNOSIS — O09523 Supervision of elderly multigravida, third trimester: Secondary | ICD-10-CM

## 2017-06-03 NOTE — Progress Notes (Signed)
Subjective:  Brianna Sampson is a 36 y.o. [id number] at [redacted]w[redacted]d being seen today for ongoing prenatal care.  She is currently monitored for the following issues for this high-risk pregnancy and has OBESITY; NICOTINE ADDICTION; DEPRESSION; Supervision of high risk pregnancy, antepartum; AMA (advanced maternal age) multigravida 35+; Low vitamin D level; and History of C-section on her problem list.  Patient reports occasional contractions and Spotting at times. Episode of leaking after using restroom earlier. Has not noticed since.  Contractions: Irregular. Vag. Bleeding: Small.  Movement: Present. Denies leaking of fluid.   The following portions of the patient's history were reviewed and updated as appropriate: allergies, current medications, past family history, past medical history, past social history, past surgical history and problem list. Problem list updated.  Objective:   Vitals:   06/03/17 1618  BP: 130/87  Pulse: 93  Weight: 189 lb (85.7 kg)    Fetal Status: Fetal Heart Rate (bpm): 145   Movement: Present     General:  Alert, oriented and cooperative. Patient is in no acute distress.  Skin: Skin is warm and dry. No rash noted.   Cardiovascular: Normal heart rate noted  Respiratory: Normal respiratory effort, no problems with respiration noted  Abdomen: Soft, gravid, appropriate for gestational age. Pain/Pressure: Present     Pelvic:  Cervical exam performed        Extremities: Normal range of motion.  Edema: Trace  Mental Status: Normal mood and affect. Normal behavior. Normal judgment and thought content.   Urinalysis:      Assessment and Plan:  Pregnancy: [id number] at [redacted]w[redacted]d  1. Supervision of high risk pregnancy, antepartum Stable Labor precautions Fern negative  2. Elderly multigravida in third trimester Low risk  3. History of C-section Desires TOLAC, papers signed  Term labor symptoms and general obstetric precautions including but not limited to vaginal bleeding,  contractions, leaking of fluid and fetal movement were reviewed in detail with the patient. Please refer to After Visit Summary for other counseling recommendations.  Return in about 1 week (around 06/10/2017) for OB visit.   Chancy Milroy, MD

## 2017-06-03 NOTE — Patient Instructions (Signed)
Vaginal Delivery Vaginal delivery means that you will give birth by pushing your baby out of your birth canal (vagina). A team of health care providers will help you before, during, and after vaginal delivery. Birth experiences are unique for every woman and every pregnancy, and birth experiences vary depending on where you choose to give birth. What should I do to prepare for my baby's birth? Before your baby is born, it is important to talk with your health care provider about:  Your labor and delivery preferences. These may include: ? Medicines that you may be given. ? How you will manage your pain. This might include non-medical pain relief techniques or injectable pain relief such as epidural analgesia. ? How you and your baby will be monitored during labor and delivery. ? Who may be in the labor and delivery room with you. ? Your feelings about surgical delivery of your baby (cesarean delivery, or C-section) if this becomes necessary. ? Your feelings about receiving donated blood through an IV tube (blood transfusion) if this becomes necessary.  Whether you are able: ? To take pictures or videos of the birth. ? To eat during labor and delivery. ? To move around, walk, or change positions during labor and delivery.  What to expect after your baby is born, such as: ? Whether delayed umbilical cord clamping and cutting is offered. ? Who will care for your baby right after birth. ? Medicines or tests that may be recommended for your baby. ? Whether breastfeeding is supported in your hospital or birth center. ? How long you will be in the hospital or birth center.  How any medical conditions you have may affect your baby or your labor and delivery experience.  To prepare for your baby's birth, you should also:  Attend all of your health care visits before delivery (prenatal visits) as recommended by your health care provider. This is important.  Prepare your home for your baby's  arrival. Make sure that you have: ? Diapers. ? Baby clothing. ? Feeding equipment. ? Safe sleeping arrangements for you and your baby.  Install a car seat in your vehicle. Have your car seat checked by a certified car seat installer to make sure that it is installed safely.  Think about who will help you with your new baby at home for at least the first several weeks after delivery.  What can I expect when I arrive at the birth center or hospital? Once you are in labor and have been admitted into the hospital or birth center, your health care provider may:  Review your pregnancy history and any concerns you have.  Insert an IV tube into one of your veins. This is used to give you fluids and medicines.  Check your blood pressure, pulse, temperature, and heart rate (vital signs).  Check whether your bag of water (amniotic sac) has broken (ruptured).  Talk with you about your birth plan and discuss pain control options.  Monitoring Your health care provider may monitor your contractions (uterine monitoring) and your baby's heart rate (fetal monitoring). You may need to be monitored:  Often, but not continuously (intermittently).  All the time or for long periods at a time (continuously). Continuous monitoring may be needed if: ? You are taking certain medicines, such as medicine to relieve pain or make your contractions stronger. ? You have pregnancy or labor complications.  Monitoring may be done by:  Placing a special stethoscope or a handheld monitoring device on your abdomen to   check your baby's heartbeat, and feeling your abdomen for contractions. This method of monitoring does not continuously record your baby's heartbeat or your contractions.  Placing monitors on your abdomen (external monitors) to record your baby's heartbeat and the frequency and length of contractions. You may not have to wear external monitors all the time.  Placing monitors inside of your uterus  (internal monitors) to record your baby's heartbeat and the frequency, length, and strength of your contractions. ? Your health care provider may use internal monitors if he or she needs more information about the strength of your contractions or your baby's heart rate. ? Internal monitors are put in place by passing a thin, flexible wire through your vagina and into your uterus. Depending on the type of monitor, it may remain in your uterus or on your baby's head until birth. ? Your health care provider will discuss the benefits and risks of internal monitoring with you and will ask for your permission before inserting the monitors.  Telemetry. This is a type of continuous monitoring that can be done with external or internal monitors. Instead of having to stay in bed, you are able to move around during telemetry. Ask your health care provider if telemetry is an option for you.  Physical exam Your health care provider may perform a physical exam. This may include:  Checking whether your baby is positioned: ? With the head toward your vagina (head-down). This is most common. ? With the head toward the top of your uterus (head-up or breech). If your baby is in a breech position, your health care provider may try to turn your baby to a head-down position so you can deliver vaginally. If it does not seem that your baby can be born vaginally, your provider may recommend surgery to deliver your baby. In rare cases, you may be able to deliver vaginally if your baby is head-up (breech delivery). ? Lying sideways (transverse). Babies that are lying sideways cannot be delivered vaginally.  Checking your cervix to determine: ? Whether it is thinning out (effacing). ? Whether it is opening up (dilating). ? How low your baby has moved into your birth canal.  What are the three stages of labor and delivery?  Normal labor and delivery is divided into the following three stages: Stage 1  Stage 1 is the  longest stage of labor, and it can last for hours or days. Stage 1 includes: ? Early labor. This is when contractions may be irregular, or regular and mild. Generally, early labor contractions are more than 10 minutes apart. ? Active labor. This is when contractions get longer, more regular, more frequent, and more intense. ? The transition phase. This is when contractions happen very close together, are very intense, and may last longer than during any other part of labor.  Contractions generally feel mild, infrequent, and irregular at first. They get stronger, more frequent (about every 2-3 minutes), and more regular as you progress from early labor through active labor and transition.  Many women progress through stage 1 naturally, but you may need help to continue making progress. If this happens, your health care provider may talk with you about: ? Rupturing your amniotic sac if it has not ruptured yet. ? Giving you medicine to help make your contractions stronger and more frequent.  Stage 1 ends when your cervix is completely dilated to 4 inches (10 cm) and completely effaced. This happens at the end of the transition phase. Stage 2  Once   your cervix is completely effaced and dilated to 4 inches (10 cm), you may start to feel an urge to push. It is common for the body to naturally take a rest before feeling the urge to push, especially if you received an epidural or certain other pain medicines. This rest period may last for up to 1-2 hours, depending on your unique labor experience.  During stage 2, contractions are generally less painful, because pushing helps relieve contraction pain. Instead of contraction pain, you may feel stretching and burning pain, especially when the widest part of your baby's head passes through the vaginal opening (crowning).  Your health care provider will closely monitor your pushing progress and your baby's progress through the vagina during stage 2.  Your  health care provider may massage the area of skin between your vaginal opening and anus (perineum) or apply warm compresses to your perineum. This helps it stretch as the baby's head starts to crown, which can help prevent perineal tearing. ? In some cases, an incision may be made in your perineum (episiotomy) to allow the baby to pass through the vaginal opening. An episiotomy helps to make the opening of the vagina larger to allow more room for the baby to fit through.  It is very important to breathe and focus so your health care provider can control the delivery of your baby's head. Your health care provider may have you decrease the intensity of your pushing, to help prevent perineal tearing.  After delivery of your baby's head, the shoulders and the rest of the body generally deliver very quickly and without difficulty.  Once your baby is delivered, the umbilical cord may be cut right away, or this may be delayed for 1-2 minutes, depending on your baby's health. This may vary among health care providers, hospitals, and birth centers.  If you and your baby are healthy enough, your baby may be placed on your chest or abdomen to help maintain the baby's temperature and to help you bond with each other. Some mothers and babies start breastfeeding at this time. Your health care team will dry your baby and help keep your baby warm during this time.  Your baby may need immediate care if he or she: ? Showed signs of distress during labor. ? Has a medical condition. ? Was born too early (prematurely). ? Had a bowel movement before birth (meconium). ? Shows signs of difficulty transitioning from being inside the uterus to being outside of the uterus. If you are planning to breastfeed, your health care team will help you begin a feeding. Stage 3  The third stage of labor starts immediately after the birth of your baby and ends after you deliver the placenta. The placenta is an organ that develops  during pregnancy to provide oxygen and nutrients to your baby in the womb.  Delivering the placenta may require some pushing, and you may have mild contractions. Breastfeeding can stimulate contractions to help you deliver the placenta.  After the placenta is delivered, your uterus should tighten (contract) and become firm. This helps to stop bleeding in your uterus. To help your uterus contract and to control bleeding, your health care provider may: ? Give you medicine by injection, through an IV tube, by mouth, or through your rectum (rectally). ? Massage your abdomen or perform a vaginal exam to remove any blood clots that are left in your uterus. ? Empty your bladder by placing a thin, flexible tube (catheter) into your bladder. ? Encourage   you to breastfeed your baby. After labor is over, you and your baby will be monitored closely to ensure that you are both healthy until you are ready to go home. Your health care team will teach you how to care for yourself and your baby. This information is not intended to replace advice given to you by your health care provider. Make sure you discuss any questions you have with your health care provider. Document Released: 06/05/2008 Document Revised: 03/16/2016 Document Reviewed: 09/11/2015 Elsevier Interactive Patient Education  2018 [employer]  

## 2017-06-04 ENCOUNTER — Encounter (HOSPITAL_COMMUNITY): Payer: Self-pay

## 2017-06-04 ENCOUNTER — Inpatient Hospital Stay (HOSPITAL_COMMUNITY)
Admission: AD | Admit: 2017-06-04 | Discharge: 2017-06-04 | Disposition: A | Discharge status: Home / Self Care | Payer: Medicaid Other | Source: Ambulatory Visit | Attending: Family Medicine | Admitting: Family Medicine

## 2017-06-04 ENCOUNTER — Telehealth: Payer: Self-pay

## 2017-06-04 DIAGNOSIS — O479 False labor, unspecified: Secondary | ICD-10-CM

## 2017-06-04 NOTE — MAU Note (Signed)
Urine in lab 

## 2017-06-04 NOTE — MAU Note (Signed)
Vaginal bleeding Spotting that started yesterday; has continued and is more than it was  Patient states no recent VE  Denies LOF  +FM  +contractions every 5-7 minutes patient reports

## 2017-06-04 NOTE — Telephone Encounter (Signed)
Patient called to inform us that since her OB visit yesterday; her contractions are more frequent 5 minutes apart and she is spotting more. Patient was advised to go to [facility].

## 2017-06-05 ENCOUNTER — Inpatient Hospital Stay (HOSPITAL_COMMUNITY)
Admission: AD | Admit: 2017-06-05 | Discharge: 2017-06-06 | DRG: 775 | Disposition: A | Discharge status: Home / Self Care | Payer: Medicaid Other | Source: Ambulatory Visit | Attending: Family Medicine | Admitting: Family Medicine

## 2017-06-05 ENCOUNTER — Encounter (HOSPITAL_COMMUNITY): Payer: Self-pay

## 2017-06-05 ENCOUNTER — Inpatient Hospital Stay (HOSPITAL_COMMUNITY): Payer: Medicaid Other | Admitting: Anesthesiology

## 2017-06-05 DIAGNOSIS — O34219 Maternal care for unspecified type scar from previous cesarean delivery: Secondary | ICD-10-CM

## 2017-06-05 DIAGNOSIS — O34211 Maternal care for low transverse scar from previous cesarean delivery: Secondary | ICD-10-CM | POA: Diagnosis present

## 2017-06-05 DIAGNOSIS — F1290 Cannabis use, unspecified, uncomplicated: Secondary | ICD-10-CM | POA: Diagnosis present

## 2017-06-05 DIAGNOSIS — O99344 Other mental disorders complicating childbirth: Secondary | ICD-10-CM | POA: Diagnosis present

## 2017-06-05 DIAGNOSIS — O99334 Smoking (tobacco) complicating childbirth: Secondary | ICD-10-CM | POA: Diagnosis present

## 2017-06-05 DIAGNOSIS — Z23 Encounter for immunization: Secondary | ICD-10-CM | POA: Diagnosis not present

## 2017-06-05 DIAGNOSIS — F17210 Nicotine dependence, cigarettes, uncomplicated: Secondary | ICD-10-CM | POA: Diagnosis present

## 2017-06-05 DIAGNOSIS — Z3A38 38 weeks gestation of pregnancy: Secondary | ICD-10-CM

## 2017-06-05 DIAGNOSIS — O99324 Drug use complicating childbirth: Principal | ICD-10-CM | POA: Diagnosis present

## 2017-06-05 DIAGNOSIS — O26893 Other specified pregnancy related conditions, third trimester: Secondary | ICD-10-CM | POA: Diagnosis present

## 2017-06-05 DIAGNOSIS — F329 Major depressive disorder, single episode, unspecified: Secondary | ICD-10-CM | POA: Diagnosis present

## 2017-06-05 LAB — CBC
HCT: 37.1 % (ref 36.0–46.0)
Hemoglobin: 13.1 g/dL (ref 12.0–15.0)
MCH: 32.6 pg (ref 26.0–34.0)
MCHC: 35.3 g/dL (ref 30.0–36.0)
MCV: 92.3 fL (ref 78.0–100.0)
PLATELETS: 256 10*3/uL (ref 150–400)
RBC: 4.02 MIL/uL (ref 3.87–5.11)
RDW: 14.6 % (ref 11.5–15.5)
WBC: 16.2 10*3/uL — ABNORMAL HIGH (ref 4.0–10.5)

## 2017-06-05 LAB — TYPE AND SCREEN
ABO/RH(D): B POS
Antibody Screen: NEGATIVE

## 2017-06-05 LAB — RPR: RPR Ser Ql: NONREACTIVE

## 2017-06-05 MED ORDER — LACTATED RINGERS IV SOLN: INTRAVENOUS | Status: DC

## 2017-06-05 MED ORDER — FLEET ENEMA 7-19 GM/118ML RE ENEM: 1.0000 | ENEMA | Freq: Every day | RECTAL | Status: DC | PRN

## 2017-06-05 MED ORDER — ONDANSETRON HCL 4 MG/2ML IJ SOLN: 4.0000 mg | INTRAMUSCULAR | Status: DC | PRN

## 2017-06-05 MED ORDER — TETANUS-DIPHTH-ACELL PERTUSSIS 5-2.5-18.5 LF-MCG/0.5 IM SUSP: 0.5000 mL | Freq: Once | INTRAMUSCULAR | Status: DC

## 2017-06-05 MED ORDER — ZOLPIDEM TARTRATE 5 MG PO TABS
5.0000 mg | ORAL_TABLET | Freq: Every evening | ORAL | Status: DC | PRN
Start: 2017-06-05 — End: 2017-06-06

## 2017-06-05 MED ORDER — EPHEDRINE 5 MG/ML INJ
10.0000 mg | INTRAVENOUS | Status: DC | PRN
  Filled 2017-06-05: qty 2

## 2017-06-05 MED ORDER — PNEUMOCOCCAL VAC POLYVALENT 25 MCG/0.5ML IJ INJ
0.5000 mL | INJECTION | INTRAMUSCULAR | Status: AC
  Administered 2017-06-06: 0.5 mL via INTRAMUSCULAR
  Filled 2017-06-05 (×2): qty 0.5

## 2017-06-05 MED ORDER — ACETAMINOPHEN 325 MG PO TABS
650.0000 mg | ORAL_TABLET | ORAL | Status: DC | PRN
  Administered 2017-06-05 (×2): 650 mg via ORAL
  Filled 2017-06-05 (×2): qty 2

## 2017-06-05 MED ORDER — COCONUT OIL OIL: 1.0000 "application " | TOPICAL_OIL | Status: DC | PRN

## 2017-06-05 MED ORDER — PHENYLEPHRINE 40 MCG/ML (10ML) SYRINGE FOR IV PUSH (FOR BLOOD PRESSURE SUPPORT)
80.0000 ug | PREFILLED_SYRINGE | INTRAVENOUS | Status: AC | PRN
  Administered 2017-06-05 (×3): 80 ug via INTRAVENOUS
  Filled 2017-06-05: qty 10

## 2017-06-05 MED ORDER — ONDANSETRON HCL 4 MG/2ML IJ SOLN: 4.0000 mg | Freq: Four times a day (QID) | INTRAMUSCULAR | Status: DC | PRN

## 2017-06-05 MED ORDER — PRENATAL MULTIVITAMIN CH
1.0000 | ORAL_TABLET | Freq: Every day | ORAL | Status: DC
  Administered 2017-06-05 – 2017-06-06 (×2): 1 via ORAL
  Filled 2017-06-05 (×2): qty 1

## 2017-06-05 MED ORDER — OXYCODONE-ACETAMINOPHEN 5-325 MG PO TABS: 1.0000 | ORAL_TABLET | ORAL | Status: DC | PRN

## 2017-06-05 MED ORDER — PHENYLEPHRINE 40 MCG/ML (10ML) SYRINGE FOR IV PUSH (FOR BLOOD PRESSURE SUPPORT)
80.0000 ug | PREFILLED_SYRINGE | INTRAVENOUS | Status: DC | PRN
  Filled 2017-06-05: qty 5

## 2017-06-05 MED ORDER — OXYTOCIN BOLUS FROM INFUSION: 500.0000 mL | Freq: Once | INTRAVENOUS | Status: DC

## 2017-06-05 MED ORDER — LIDOCAINE HCL (PF) 1 % IJ SOLN
INTRAMUSCULAR | Status: DC | PRN
  Administered 2017-06-05 (×2): 5 mL

## 2017-06-05 MED ORDER — BENZOCAINE-MENTHOL 20-0.5 % EX AERO: 1.0000 "application " | INHALATION_SPRAY | CUTANEOUS | Status: DC | PRN

## 2017-06-05 MED ORDER — SENNOSIDES-DOCUSATE SODIUM 8.6-50 MG PO TABS
2.0000 | ORAL_TABLET | ORAL | Status: DC
  Administered 2017-06-05: 2 via ORAL
  Filled 2017-06-05: qty 2

## 2017-06-05 MED ORDER — FENTANYL CITRATE (PF) 100 MCG/2ML IJ SOLN
INTRAMUSCULAR | Status: AC
  Filled 2017-06-05: qty 2

## 2017-06-05 MED ORDER — ACETAMINOPHEN 325 MG PO TABS: 650.0000 mg | ORAL_TABLET | ORAL | Status: DC | PRN

## 2017-06-05 MED ORDER — SIMETHICONE 80 MG PO CHEW
80.0000 mg | CHEWABLE_TABLET | ORAL | Status: DC | PRN
  Administered 2017-06-05: 80 mg via ORAL
  Filled 2017-06-05: qty 1

## 2017-06-05 MED ORDER — OXYCODONE-ACETAMINOPHEN 5-325 MG PO TABS: 2.0000 | ORAL_TABLET | ORAL | Status: DC | PRN

## 2017-06-05 MED ORDER — DIBUCAINE 1 % RE OINT: 1.0000 "application " | TOPICAL_OINTMENT | RECTAL | Status: DC | PRN

## 2017-06-05 MED ORDER — FENTANYL CITRATE (PF) 100 MCG/2ML IJ SOLN
100.0000 ug | INTRAMUSCULAR | Status: DC | PRN
  Administered 2017-06-05: 100 ug via INTRAVENOUS

## 2017-06-05 MED ORDER — LIDOCAINE HCL (PF) 1 % IJ SOLN
30.0000 mL | INTRAMUSCULAR | Status: DC | PRN
  Filled 2017-06-05: qty 30

## 2017-06-05 MED ORDER — ONDANSETRON HCL 4 MG PO TABS: 4.0000 mg | ORAL_TABLET | ORAL | Status: DC | PRN

## 2017-06-05 MED ORDER — SOD CITRATE-CITRIC ACID 500-334 MG/5ML PO SOLN: 30.0000 mL | ORAL | Status: DC | PRN

## 2017-06-05 MED ORDER — FENTANYL 2.5 MCG/ML BUPIVACAINE 1/10 % EPIDURAL INFUSION (WH - ANES)
14.0000 mL/h | INTRAMUSCULAR | Status: DC | PRN
  Administered 2017-06-05: 14 mL/h via EPIDURAL
  Filled 2017-06-05: qty 100

## 2017-06-05 MED ORDER — IBUPROFEN 600 MG PO TABS
600.0000 mg | ORAL_TABLET | Freq: Four times a day (QID) | ORAL | Status: DC
  Administered 2017-06-05 – 2017-06-06 (×6): 600 mg via ORAL
  Filled 2017-06-05 (×6): qty 1

## 2017-06-05 MED ORDER — LACTATED RINGERS IV SOLN: 500.0000 mL | Freq: Once | INTRAVENOUS | Status: DC

## 2017-06-05 MED ORDER — WITCH HAZEL-GLYCERIN EX PADS: 1.0000 "application " | MEDICATED_PAD | CUTANEOUS | Status: DC | PRN

## 2017-06-05 MED ORDER — LACTATED RINGERS IV SOLN: 500.0000 mL | INTRAVENOUS | Status: DC | PRN

## 2017-06-05 MED ORDER — OXYTOCIN 40 UNITS IN LACTATED RINGERS INFUSION - SIMPLE MED
2.5000 [IU]/h | INTRAVENOUS | Status: DC
  Filled 2017-06-05: qty 1000

## 2017-06-05 MED ORDER — DIPHENHYDRAMINE HCL 50 MG/ML IJ SOLN: 12.5000 mg | INTRAMUSCULAR | Status: DC | PRN

## 2017-06-05 MED ORDER — DIPHENHYDRAMINE HCL 25 MG PO CAPS: 25.0000 mg | ORAL_CAPSULE | Freq: Four times a day (QID) | ORAL | Status: DC | PRN

## 2017-06-05 NOTE — Anesthesia Postprocedure Evaluation (Signed)
Anesthesia Post Note  Patient: Brianna Sampson  Procedure(s) Performed: * No procedures listed *     Patient location during evaluation: Mother Baby Anesthesia Type: Epidural Level of consciousness: awake and alert and oriented Pain management: satisfactory to patient Vital Signs Assessment: post-procedure vital signs reviewed and stable Respiratory status: spontaneous breathing and nonlabored ventilation Cardiovascular status: stable Postop Assessment: no headache, no backache, no signs of nausea or vomiting, adequate PO intake and patient able to bend at knees (patient up walking) Anesthetic complications: no    Last Vitals:  Vitals:   06/05/17 0557 06/05/17 1014  BP: 110/66 118/70  Pulse: 79 80  Resp: 20 18  Temp: 36.8 C 36.7 C  SpO2: 98%     Last Pain:  Vitals:   06/05/17 1223  TempSrc:   PainSc: 0-No pain   Pain Goal:                 Jaeley Wiker

## 2017-06-05 NOTE — Anesthesia Preprocedure Evaluation (Signed)

## 2017-06-05 NOTE — MAU Note (Signed)
Pt reports contractions every 1-2 mins. Denies LOF or vaginal bleeding. States she was 2-3cm earlier today. Reports fetal movement although has not felt any recently.

## 2017-06-05 NOTE — H&P (Signed)
OBSTETRIC ADMISSION HISTORY AND PHYSICAL  Brianna Sampson is a 36 y.o. female [id number] with IUP at 103w2d by LMP presenting for SOL. Patient reports small amount of spotting earlier today and mild RUQ pain. Contractions that are every 2 minutes. She reports +FMs, No LOF, no blurry vision, headaches or peripheral edema.  She plans on breast/bottle feeding. She request POP for birth control. She received her prenatal care at [facility]   Dating: By LMP --->  Estimated Date of Delivery: 06/17/17  Sono:    @[redacted]w[redacted]d , CWD, normal anatomy, cephalic presentation,  Anterior placenta, 490g, 48% EFW  Prenatal History/Complications: AMA H/o c-section with 2 successful VBACs Depression  Past Medical History: Past Medical History:  Diagnosis Date  . Anxiety   . ARTHRITIS, RIGHT KNEE 08/16/2008   Qualifier: Diagnosis of  By: Moshe Cipro MD, Joycelyn Schmid    . Depression     Past Surgical History: Past Surgical History:  Procedure Laterality Date  . CESAREAN SECTION    . CHOLECYSTECTOMY  2002    Obstetrical History: OB History    Gravida Para Term Preterm AB Living   8 4 4   3 4    SAB TAB Ectopic Multiple Live Births   1 2     4       Social History: Social History   Social History  . Marital status: Married    Spouse name: N/A  . Number of children: N/A  . Years of education: N/A   Social History Main Topics  . Smoking status: Current Every Day Smoker    Packs/day: 0.25    Types: Cigarettes  . Smokeless tobacco: Never Used  . Alcohol use No  . Drug use: Yes    Types: Marijuana     Comment: last smoked about 1 wk ago  . Sexual activity: Yes   Other Topics Concern  . Not on file   Social History Narrative  . No narrative on file    Family History: Family History  Problem Relation Age of Onset  . Hypertension Mother   . Diabetes Mother   . Heart disease Mother     Allergies: No Known Allergies  Prescriptions Prior to Admission  Medication Sig Dispense Refill Last Dose  .  acetaminophen (TYLENOL) 500 MG tablet Take 1,000 mg by mouth every 6 (six) hours as needed for mild pain or headache.   06/03/2017 at Unknown time  . docusate sodium (COLACE) 100 MG capsule Take 1 capsule (100 mg total) by mouth 2 (two) times daily as needed. (Patient not taking: Reported on 06/04/2017) 30 capsule 2 Not Taking at Unknown time  . omeprazole (PRILOSEC) 20 MG capsule Take 1 capsule (20 mg total) by mouth 2 (two) times daily before a meal. (Patient not taking: Reported on 06/04/2017) 60 capsule 5 Not Taking at Unknown time  . phenylephrine-shark liver oil-mineral oil-petrolatum (PREPARATION H) 0.25-3-14-71.9 % rectal ointment Place 1 application rectally 2 (two) times daily as needed for hemorrhoids. (Patient not taking: Reported on 05/20/2017) 30 g 0 Not Taking at Unknown time  . Prenatal-DSS-FeCb-FeGl-FA (CITRANATAL BLOOM) 90-1 MG TABS   1 06/03/2017 at Unknown time  . Vitamin D, Ergocalciferol, (DRISDOL) 50000 units CAPS capsule Take 1 capsule (50,000 Units total) by mouth every 7 (seven) days. 30 capsule 2 06/03/2017 at Unknown time     Review of Systems   All systems reviewed and negative except as stated in HPI  Blood pressure (!) 113/93, pulse (!) 108, temperature 98.3 F (36.8 C), temperature source Oral,  resp. rate 18, height 4\' 11"  (1.499 m), weight 85.7 kg (189 lb), last menstrual period 09/10/2016, SpO2 99 %, unknown if currently breastfeeding. General appearance: alert, cooperative, appears stated age and mild distress Lungs: clear to auscultation bilaterally Heart: regular rate and rhythm Abdomen: soft, non-tender; bowel sounds normal Pelvic: deferred  Extremities: Homans sign is negative, no sign of DVT DTR's 2+ Presentation: cephalic Fetal monitoringBaseline: 135 bpm, Variability: Good {> 6 bpm), Accelerations: 15x15 and Decelerations: Absent Uterine activity, Frequency: Every 2-3 minutes, Duration: 60-80 seconds and Intensity: moderate Dilation: 5 Effacement (%):  100 Exam by:: Esau Grew RN   Prenatal labs: ABO, Rh: B/Positive/-- (03/27 1624) Antibody: Negative (03/27 1624) Rubella: 2.00 (03/27 1624) RPR: Non Reactive (07/20 1033)  HBsAg: Negative (03/27 1624)  HIV:   Non-reactive GBS: Negative (09/10 1636)  1 hr Glucola 124 (normal) Genetic screening  normal Anatomy US normal  Prenatal Transfer Tool  Maternal Diabetes: No Genetic Screening: Normal Maternal Ultrasounds/Referrals: Normal Fetal Ultrasounds or other Referrals:  None Maternal Substance Abuse:  No Significant Maternal Medications:  None Significant Maternal Lab Results: Lab values include: Group B Strep negative  No results found for this or any previous visit (from the past 24 hour(s)).  Patient Active Problem List   Diagnosis Date Noted  . History of C-section 05/06/2017  . Low vitamin D level 12/13/2016  . Supervision of high risk pregnancy, antepartum 12/04/2016  . AMA (advanced maternal age) multigravida 35+ 12/04/2016  . OBESITY 02/12/2008  . NICOTINE ADDICTION 02/12/2008  . DEPRESSION 02/12/2008    Assessment/Plan:  Brianna Sampson is a 36 y.o. [id number] at [redacted]w[redacted]d here for SOL  #Labor:Active labor with expectant management.  #Pain: epidural #FWB: Category 1 #ID:  none #MOF: breast/formula #MOC: IUD #Circ:  n/a  [name], Student-PA  06/05/2017, 1:00 AM   OB FELLOW HISTORY AND PHYSICAL ATTESTATION  I confirm that I have verified the information documented in the physician assistant's note and that I have also personally reperformed the physical exam and all medical decision making activities. I agree with above documentation and have made edits as needed.   Luiz Blare, DO OB Fellow 06/05/2017, 3:40 AM

## 2017-06-05 NOTE — Anesthesia Procedure Notes (Signed)
Epidural Patient location during procedure: OB  Staffing Anesthesiologist: Zollie Ellery Performed: anesthesiologist   Preanesthetic Checklist Completed: patient identified, site marked, surgical consent, pre-op evaluation, timeout performed, IV checked, risks and benefits discussed and monitors and equipment checked  Epidural Patient position: sitting Prep: DuraPrep Patient monitoring: heart rate, continuous pulse ox and blood pressure Approach: right paramedian Location: L3-L4 Injection technique: LOR saline  Needle:  Needle type: Tuohy  Needle gauge: 17 G Needle length: 9 cm and 9 Needle insertion depth: 6 cm Catheter type: closed end flexible Catheter size: 20 Guage Catheter at skin depth: 10 cm Test dose: negative  Assessment Events: blood not aspirated, injection not painful, no injection resistance, negative IV test and no paresthesia  Additional Notes Patient identified. Risks/Benefits/Options discussed with patient including but not limited to bleeding, infection, nerve damage, paralysis, failed block, incomplete pain control, headache, blood pressure changes, nausea, vomiting, reactions to medication both or allergic, itching and postpartum back pain. Confirmed with bedside nurse the patient's most recent platelet count. Confirmed with patient that they are not currently taking any anticoagulation, have any bleeding history or any family history of bleeding disorders. Patient expressed understanding and wished to proceed. All questions were answered. Sterile technique was used throughout the entire procedure. Please see nursing notes for vital signs. Test dose was given through epidural needle and negative prior to continuing to dose epidural or start infusion. Warning signs of high block given to the patient including shortness of breath, tingling/numbness in hands, complete motor block, or any concerning symptoms with instructions to call for help. Patient was given  instructions on fall risk and not to get out of bed. All questions and concerns addressed with instructions to call with any issues.     

## 2017-06-06 MED ORDER — IBUPROFEN 600 MG PO TABS: 600.0000 mg | ORAL_TABLET | Freq: Four times a day (QID) | ORAL | 0 refills | Status: DC

## 2017-06-06 MED ORDER — SENNOSIDES-DOCUSATE SODIUM 8.6-50 MG PO TABS: 2.0000 | ORAL_TABLET | Freq: Every evening | ORAL | 0 refills | Status: DC | PRN

## 2017-06-06 NOTE — Discharge Instructions (Signed)

## 2017-06-06 NOTE — Clinical Social Work Maternal (Signed)
CLINICAL SOCIAL WORK MATERNAL/CHILD NOTE  Patient Details  Name: Brianna Sampson MRN: [id number] Date of Birth: 12/20/1980  Date:  06/06/2017  Clinical Social Worker Initiating Note:  Laurey Arrow Date/Time: Initiated:  06/06/17/1223     Child's Name:  Brianna Sampson   Biological Parents:  Mother, Father (FOB is Dola Factor 05/23/74)   Need for Interpreter:  None   Reason for Referral:  Current Substance Use/Substance Use During Pregnancy    Address:  [address] [place] [address]    Phone number:  [phone] (home)     Additional phone number:   Household Members/Support Persons (HM/SP):   Household Member/Support Person 1, Household Member/Support Person 2, Household Member/Support Person 3   HM/SP Name Relationship DOB or Age  HM/SP -1 Gershon Mussel son 04/07/1998  HM/SP -2 Inis Sizer  son 05/31/1999  HM/SP -3 Larry Sierras son  10/10/2000  HM/SP -4        HM/SP -5        HM/SP -6        HM/SP -7        HM/SP -8          Natural Supports (not living in the home):  Spouse/significant other, Immediate Family (FOB's sister will also provide support)   Professional Supports: Transport planner (MOB receives therapy at [facility].)   Employment: Full-time   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    Education:  Nurse, adult   Homebound arranged:    Museum/gallery curator Resources:  Medicaid   Other Resources:  [employer], Physicist, medical    Cultural/Religious Considerations Which May Impact Care:  None Reported  Strengths:  Ability to meet basic needs , Home prepared for child , Pediatrician chosen   Psychotropic Medications:         Pediatrician:    [place]  Pediatrician List:   [place]      Pediatrician Fax Number:    Risk Factors/Current Problems:  Substance Use , Mental Health  Concerns , DHHS Involvement    Cognitive State:  Alert , Able to Concentrate , Linear Thinking , Insightful    Mood/Affect:  Happy , Calm , Relaxed , Interested , Comfortable    CSW Assessment: CSW met with MOB to complete an assessment for a consult for hx of THC use in pregnancy and hx of anxiety/depression.  MOB was polite and was receptive with meeting with CSW.  MOB communicated that MOB anticipated meeting with CSW due to infant's UDS results. With MOB's permission, CSW asked FOB to leave the room in effort to meet with MOB in private.  CSW inquired about MOB's substance use, and MOB reported utilizing marijuana during pregnancy to assist MOB with relaxing.  MOB stated "I smoke because I could not afford my medication for my bipolar disorder, and the marijuana relaxed me." CSW thanked MOB for her honesty and informed MOB of the hospital's drug screen policy. CSW was made aware of the 2 drug screenings for the infant.  MOB was understanding and did not have any questions.  CSW shared with MOB that the infant had a positive UDS for [facility] and CSW will make a report to [place]. CSW also made MOB aware that CSW will monitor infant's CDS and will report results to CPS. MOB denied using all other  illicit substance and had several questions regarding CPS process; CSW provided answers.  MOB denied a hx of CPS involvement.  CSW offered MOB resources and referrals for substance interventions and parenting and MOB declined.    CSW asked about MOB's MH hx and MOB openly disclosed a hx of bipolar disorder.  MOB reported that MOB is an established patient at [facility].  MOB was receiving medication management (taking Latuda daily) and outpatient therapy until MOB's Medicaid was transferred to pregnancy Medicaid about 2 months ago.  MOB communicated that Pregnancy Medicaid will not cover any of MOB's MH needs.  CSW processed with MOB other options to receive [facility] services; MOB believes  the birth of this baby will qualify MOB for regular Medicaid and MOB will be able to resume [place] treatment at [facility].  CSW provided education regarding Baby Blues vs PMADs and provided MOB with information about support groups held at [facility] encouraged MOB to evaluate her mental health throughout the postpartum period with the use of the New Mom Checklist developed by [facility]partum [facility] and notify a medical professional if symptoms arise.    SIDS education was also provided.  CSW made a report to [place] with [facility]. CPS will follow up with family within 72 hours.  There are no barriers to d/c.  CSW Plan/Description:  Other Information/Referral to [employer], Sudden Infant Death Syndrome (SIDS) Education, No Further Intervention Required/No Barriers to Discharge, Perinatal Mood and Anxiety Disorder (PMADs) Education, Other Patient/Family Education, [place], Child Protective Service Report    Laurey Arrow, MSW, LCSW Clinical Social Work [phone]   Dimple Nanas, LCSW 06/06/2017, 12:28 PM

## 2017-06-06 NOTE — Lactation Note (Signed)
This note was copied from a baby's chart. Lactation Consultation Note Mom sleeping. FOB awake holding baby. Parents are mainly at this time formula feeding. FOB stated mom BF her other children. LC told FOB, would f/u w/mom when she was awake. To call for [facility], will come if available at that time. Left brochure and supplementing guidelines. Patient Name: Brianna Sampson HUTML'Y Date: 06/06/2017 Reason for consult: Initial assessment   Maternal Data Does the patient have breastfeeding experience prior to this delivery?: Yes  Feeding Feeding Type: Bottle Fed - Formula Nipple Type: Slow - flow  LATCH Score                   Interventions    Lactation Tools Discussed/Used     Consult Status Consult Status: Follow-up Date: 06/06/17 Follow-up type: In-patient    Torianne Laflam, Elta Guadeloupe 06/06/2017, 1:52 AM

## 2017-06-06 NOTE — Discharge Summary (Signed)
OB Discharge Summary     Patient Name: Brianna Sampson DOB: [birth year] MRN: [id number]  Date of admission: 06/05/2017 Delivering MD: Katheren Shams   Date of discharge: 06/06/2017  Admitting diagnosis: 37 WEEKS PAIN Intrauterine pregnancy: [redacted]w[redacted]d     Secondary diagnosis:  Active Problems:   Labor and delivery, indication for care   VBAC, delivered  Additional problems:  Patient Active Problem List   Diagnosis Date Noted  . Labor and delivery, indication for care 06/05/2017  . VBAC, delivered 06/05/2017  . History of C-section 05/06/2017  . Low vitamin D level 12/13/2016  . Supervision of high risk pregnancy, antepartum 12/04/2016  . AMA (advanced maternal age) multigravida 35+ 12/04/2016  . OBESITY 02/12/2008  . NICOTINE ADDICTION 02/12/2008  . DEPRESSION 02/12/2008       Discharge diagnosis: Term Pregnancy Delivered and VBAC                                                                                                Post partum procedures:None  Augmentation: AROM  Complications: None  Hospital course:  Onset of Labor With Vaginal Delivery     36 y.o. yo [id number] at [redacted]w[redacted]d was admitted in Active Labor on 06/05/2017. Patient had an uncomplicated labor course as follows:  Membrane Rupture Time/Date: 2:26 AM ,06/05/2017   Intrapartum Procedures: Episiotomy: None [1]                                         Lacerations:  None [1]  Patient had a delivery of a Viable infant. 06/05/2017  Information for the patient's newborn:  Geniene, List [[id number]]  Delivery Method: VBAC, Spontaneous (Filed from Delivery Summary)    Pateint had an uncomplicated postpartum course.  She is ambulating, tolerating a regular diet, passing flatus, and urinating well. Patient is discharged home in stable condition on 06/07/17.   Physical exam  Vitals:   06/05/17 0557 06/05/17 1014 06/05/17 1806 06/06/17 0457  BP: 110/66 118/70 122/68 117/66  Pulse: 79 80 78 73  Resp: 20 18 19 16    Temp: 98.2 F (36.8 C) 98 F (36.7 C) 97.7 F (36.5 C) 98 F (36.7 C)  TempSrc: Oral  Axillary Oral  SpO2: 98%     Weight:      Height:       General: alert, cooperative and no distress Lochia: appropriate Uterine Fundus: firm Incision: N/A DVT Evaluation: No evidence of DVT seen on physical exam. Labs: Lab Results  Component Value Date   WBC 16.2 (H) 06/05/2017   HGB 13.1 06/05/2017   HCT 37.1 06/05/2017   MCV 92.3 06/05/2017   PLT 256 06/05/2017   CMP Latest Ref Rng & Units 10/22/2016  Glucose 65 - 99 mg/dL 100(H)  BUN 6 - 20 mg/dL 10  Creatinine 0.44 - 1.00 mg/dL 0.65  Sodium 135 - 145 mmol/L 139  Potassium 3.5 - 5.1 mmol/L 3.9  Chloride 101 - 111 mmol/L 106  CO2 22 - 32 mmol/L 25  Calcium 8.9 - 10.3 mg/dL 9.3  Total Protein 6.5 - 8.1 g/dL 7.3  Total Bilirubin 0.3 - 1.2 mg/dL 0.3  Alkaline Phos 38 - 126 U/L 66  AST 15 - 41 U/L 13(L)  ALT 14 - 54 U/L 10(L)    Discharge instruction: per After Visit Summary and "Baby and Me Booklet".  After visit meds:  Allergies as of 06/06/2017   No Known Allergies     Medication List    STOP taking these medications   omeprazole 20 MG capsule Commonly known as:  PRILOSEC   phenylephrine-shark liver oil-mineral oil-petrolatum 0.25-3-14-71.9 % rectal ointment Commonly known as:  PREPARATION H     TAKE these medications   acetaminophen 500 MG tablet Commonly known as:  TYLENOL Take 1,000 mg by mouth every 6 (six) hours as needed for mild pain or headache.   CITRANATAL BLOOM 90-1 MG Tabs Take 1 tablet by mouth daily.   docusate sodium 100 MG capsule Commonly known as:  COLACE Take 1 capsule (100 mg total) by mouth 2 (two) times daily as needed.   ibuprofen 600 MG tablet Commonly known as:  ADVIL,MOTRIN Take 1 tablet (600 mg total) by mouth every 6 (six) hours.   senna-docusate 8.6-50 MG tablet Commonly known as:  Senokot-S Take 2 tablets by mouth at bedtime as needed for mild constipation.   Vitamin D  (Ergocalciferol) 50000 units Caps capsule Commonly known as:  DRISDOL Take 1 capsule (50,000 Units total) by mouth every 7 (seven) days.            Discharge Care Instructions        Start     Ordered   06/06/17 0000  ibuprofen (ADVIL,MOTRIN) 600 MG tablet  Every 6 hours     06/06/17 0852   06/06/17 0000  senna-docusate (SENOKOT-S) 8.6-50 MG tablet  At bedtime PRN     06/06/17 0852   06/05/17 0000  OB RESULTS CONSOLE GC/Chlamydia    Comments:  This external order was created through the Results Console.   06/05/17 0116      Diet: routine diet  Activity: Advance as tolerated. Pelvic rest for 6 weeks.   Outpatient follow up:4 weeks Follow up Appt:No future appointments. Follow up Visit: [facility]. Schedule an appointment as soon as possible for a visit.   Specialty:  Obstetrics and Gynecology Why:  For psotpartum visit in 4-6 weeks Contact information: [address], [place] [address] [place] [phone]          Postpartum contraception: Progesterone only pills  Newborn Data: Live born female  Birth Weight: 5 lb 4 oz (2380 g) APGAR: 7, 8  Baby Feeding: Bottle and Breast Disposition:home with mother   06/06/2017 Luiz Blare, DO

## 2017-06-06 NOTE — Lactation Note (Signed)
This note was copied from a baby's chart. Lactation Consultation Note  Patient Name: Brianna Sampson RWERX'[id number] Date: 06/06/2017 Reason for consult: Follow-up assessment  Baby 26 hours old. Mom has open formula at bedside and states that she intends to start pumping when her breasts fill. Discussed supply and demand and enc putting baby to breast first with each feeding. Offered to assist with latching, but mom declined. Enc mom to call for assistance as needed.   Maternal Data    Feeding Feeding Type: Bottle Fed - Formula Nipple Type: Slow - flow  LATCH Score                   Interventions    Lactation Tools Discussed/Used     Consult Status Consult Status: Follow-up Date: 06/07/17 Follow-up type: In-patient    Andres Labrum 06/06/2017, 10:26 AM

## 2017-06-07 ENCOUNTER — Ambulatory Visit: Payer: Self-pay

## 2017-06-07 NOTE — Lactation Note (Signed)
This note was copied from a baby's chart. Lactation Consultation Note  Patient Name: Brianna Sampson Date: 06/07/2017 Reason for consult: Follow-up assessment;Infant weight loss;Infant < 6lbs;Other (Comment) (per mom plans to pump when she goes home  and latch )  Baby is 36 hours old , and recently was fed a bottle by dad - 26 ml  LC reviewed and updated doc flow sheets per mom .  Per mom still planning to pump when she gets home and work on the breast feeding.  LC reviewed the importance of the 1st 2 weeks of breast feeding and the stimulation to the breast consistently.  LC mentioned to mom often when baby's have had bottles, they struggle to latch , may need to give her and appetizer  And then latch at the breast. Also may be easier once the milk comes in.  Sore nipple and engorgement prevention and tx reviewed.  Per  Mom will have a pump at home.  Mother informed of post-discharge support and given phone number to the lactation department, including services for phone call assistance; out-patient appointments; and breastfeeding support group. List of other breastfeeding resources in the community given in the handout. Encouraged mother to call for problems or concerns related to breastfeeding.  Maternal Data Has patient been taught Hand Expression?:  (mom is experienced )  Feeding Feeding Type:  (baby recently bottle fed ) Nipple Type: Slow - flow  LATCH Score                   Interventions Interventions: Breast feeding basics reviewed  Lactation Tools Discussed/Used     Consult Status Consult Status: Complete Date: 06/07/17    Myer Haff 06/07/2017, 8:23 AM

## 2017-06-10 ENCOUNTER — Encounter: Payer: Self-pay | Admitting: *Deleted

## 2017-06-10 ENCOUNTER — Encounter: Payer: Medicaid Other | Admitting: Obstetrics and Gynecology

## 2017-07-03 ENCOUNTER — Ambulatory Visit: Payer: Medicaid Other | Admitting: Obstetrics & Gynecology

## 2017-07-23 ENCOUNTER — Encounter: Payer: Self-pay | Admitting: Obstetrics and Gynecology

## 2017-07-23 ENCOUNTER — Ambulatory Visit (INDEPENDENT_AMBULATORY_CARE_PROVIDER_SITE_OTHER): Payer: Medicaid Other | Admitting: Obstetrics and Gynecology

## 2017-07-23 DIAGNOSIS — Z1389 Encounter for screening for other disorder: Secondary | ICD-10-CM | POA: Diagnosis not present

## 2017-07-23 DIAGNOSIS — Z30011 Encounter for initial prescription of contraceptive pills: Secondary | ICD-10-CM

## 2017-07-23 DIAGNOSIS — Z309 Encounter for contraceptive management, unspecified: Secondary | ICD-10-CM | POA: Insufficient documentation

## 2017-07-23 MED ORDER — NORETHINDRONE 0.35 MG PO TABS: 1.0000 | ORAL_TABLET | Freq: Every day | ORAL | 11 refills | Status: DC

## 2017-07-23 NOTE — Progress Notes (Signed)
Post Partum Exam  Brianna Sampson is a 36 y.o. [id number] female who presents for a postpartum visit. She is 6 weeks postpartum following a spontaneous vaginal delivery. I have fully reviewed the prenatal and intrapartum course. The delivery was at [redacted]w[redacted]d gestational weeks.  Anesthesia: epidural. Postpartum course has been UNREMARKABLE. Baby's course has been UNREMARKABLE. Baby is feeding by both breast and bottle - Brianna Sampson . Bleeding no bleeding. Bowel function is normal. Bladder function is normal. Patient is sexually active. Contraception method is none. Postpartum depression screening:neg  The following portions of the patient's history were reviewed and updated as appropriate: allergies, current medications, past family history, past medical history, past social history and past surgical history.  Review of Systems Pertinent items are noted in HPI.    Objective:  Blood pressure 133/87, pulse 80, weight 179 lb (81.2 kg), last menstrual period 07/08/2017, unknown if currently breastfeeding.  General:  alert   Breasts:  deferred  Lungs: clear to auscultation bilaterally  Heart:  regular rate and rhythm, S1, S2 normal, no murmur, click, rub or gallop  Abdomen: soft, non-tender; bowel sounds normal; no masses,  no organomegaly   Vulva:  not evaluated  Vagina: not evaluated  Cervix:  not evaluated  Corpus: not examined  Adnexa:  not evaluated  Rectal Exam: Not performed.        Assessment:    NL postpartum exam. Pap smear Normal 12/04/16  Plan:   1. Contraception: oral progesterone-only contraceptive  U/R/B and back up method reviewed with pt. Advised to reframe from IC x 2 weeks. After 2 weeks take home UPT and if negative start OCP's 2. Return to nl ADL's 3. Follow up in: 1 year or as needed.

## 2017-07-23 NOTE — Patient Instructions (Addendum)
Health Maintenance, Female Adopting a healthy lifestyle and getting preventive care can go a long way to promote health and wellness. Talk with your health care provider about what schedule of regular examinations is right for you. This is a good chance for you to check in with your provider about disease prevention and staying healthy. In between checkups, there are plenty of things you can do on your own. Experts have done a lot of research about which lifestyle changes and preventive measures are most likely to keep you healthy. Ask your health care provider for more information. Weight and diet Eat a healthy diet  Be sure to include plenty of vegetables, fruits, low-fat dairy products, and lean protein.  Do not eat a lot of foods high in solid fats, added sugars, or salt.  Get regular exercise. This is one of the most important things you can do for your health. ? Most adults should exercise for at least 150 minutes each week. The exercise should increase your heart rate and make you sweat (moderate-intensity exercise). ? Most adults should also do strengthening exercises at least twice a week. This is in addition to the moderate-intensity exercise.  Maintain a healthy weight  Body mass index (BMI) is a measurement that can be used to identify possible weight problems. It estimates body fat based on height and weight. Your health care provider can help determine your BMI and help you achieve or maintain a healthy weight.  For females 20 years of age and older: ? A BMI below 18.5 is considered underweight. ? A BMI of 18.5 to 24.9 is normal. ? A BMI of 25 to 29.9 is considered overweight. ? A BMI of 30 and above is considered obese.  Watch levels of cholesterol and blood lipids  You should start having your blood tested for lipids and cholesterol at 38 years of age, then have this test every 5 years.  You may need to have your cholesterol levels checked more often if: ? Your lipid or  cholesterol levels are high. ? You are older than 38 years of age. ? You are at high risk for heart disease.  Cancer screening Lung Cancer  Lung cancer screening is recommended for adults 55-80 years old who are at high risk for lung cancer because of a history of smoking.  A yearly low-dose CT scan of the lungs is recommended for people who: ? Currently smoke. ? Have quit within the past 15 years. ? Have at least a 30-pack-year history of smoking. A pack year is smoking an average of one pack of cigarettes a day for 1 year.  Yearly screening should continue until it has been 15 years since you quit.  Yearly screening should stop if you develop a health problem that would prevent you from having lung cancer treatment.  Breast Cancer  Practice breast self-awareness. This means understanding how your breasts normally appear and feel.  It also means doing regular breast self-exams. Let your health care provider know about any changes, no matter how small.  If you are in your 20s or 30s, you should have a clinical breast exam (CBE) by a health care provider every 1-3 years as part of a regular health exam.  If you are 40 or older, have a CBE every year. Also consider having a breast X-ray (mammogram) every year.  If you have a family history of breast cancer, talk to your health care provider about genetic screening.  If you are at high risk   for breast cancer, talk to your health care provider about having an MRI and a mammogram every year.  Breast cancer gene (BRCA) assessment is recommended for women who have family members with BRCA-related cancers. BRCA-related cancers include: ? Breast. ? Ovarian. ? Tubal. ? Peritoneal cancers.  Results of the assessment will determine the need for genetic counseling and BRCA1 and BRCA2 testing.  Cervical Cancer Your health care provider may recommend that you be screened regularly for cancer of the pelvic organs (ovaries, uterus, and  vagina). This screening involves a pelvic examination, including checking for microscopic changes to the surface of your cervix (Pap test). You may be encouraged to have this screening done every 3 years, beginning at age 22.  For women ages 56-65, health care providers may recommend pelvic exams and Pap testing every 3 years, or they may recommend the Pap and pelvic exam, combined with testing for human papilloma virus (HPV), every 5 years. Some types of HPV increase your risk of cervical cancer. Testing for HPV may also be done on women of any age with unclear Pap test results.  Other health care providers may not recommend any screening for nonpregnant women who are considered low risk for pelvic cancer and who do not have symptoms. Ask your health care provider if a screening pelvic exam is right for you.  If you have had past treatment for cervical cancer or a condition that could lead to cancer, you need Pap tests and screening for cancer for at least 20 years after your treatment. If Pap tests have been discontinued, your risk factors (such as having a new sexual partner) need to be reassessed to determine if screening should resume. Some women have medical problems that increase the chance of getting cervical cancer. In these cases, your health care provider may recommend more frequent screening and Pap tests.  Colorectal Cancer  This type of cancer can be detected and often prevented.  Routine colorectal cancer screening usually begins at 38 years of age and continues through 38 years of age.  Your health care provider may recommend screening at an earlier age if you have risk factors for colon cancer.  Your health care provider may also recommend using home test kits to check for hidden blood in the stool.  A small camera at the end of a tube can be used to examine your colon directly (sigmoidoscopy or colonoscopy). This is done to check for the earliest forms of colorectal  cancer.  Routine screening usually begins at age 33.  Direct examination of the colon should be repeated every 5-10 years through 38 years of age. However, you may need to be screened more often if early forms of precancerous polyps or small growths are found.  Skin Cancer  Check your skin from head to toe regularly.  Tell your health care provider about any new moles or changes in moles, especially if there is a change in a mole's shape or color.  Also tell your health care provider if you have a mole that is larger than the size of a pencil eraser.  Always use sunscreen. Apply sunscreen liberally and repeatedly throughout the day.  Protect yourself by wearing long sleeves, pants, a wide-brimmed hat, and sunglasses whenever you are outside.  Heart disease, diabetes, and high blood pressure  High blood pressure causes heart disease and increases the risk of stroke. High blood pressure is more likely to develop in: ? People who have blood pressure in the high end of  the normal range (130-139/85-89 mm Hg). ? People who are overweight or obese. ? People who are African American.  If you are 21-29 years of age, have your blood pressure checked every 3-5 years. If you are 3 years of age or older, have your blood pressure checked every year. You should have your blood pressure measured twice-once when you are at a hospital or clinic, and once when you are not at a hospital or clinic. Record the average of the two measurements. To check your blood pressure when you are not at a hospital or clinic, you can use: ? An automated blood pressure machine at a pharmacy. ? A home blood pressure monitor.  If you are between 17 years and 37 years old, ask your health care provider if you should take aspirin to prevent strokes.  Have regular diabetes screenings. This involves taking a blood sample to check your fasting blood sugar level. ? If you are at a normal weight and have a low risk for diabetes,  have this test once every three years after 38 years of age. ? If you are overweight and have a high risk for diabetes, consider being tested at a younger age or more often. Preventing infection Hepatitis B  If you have a higher risk for hepatitis B, you should be screened for this virus. You are considered at high risk for hepatitis B if: ? You were born in a country where hepatitis B is common. Ask your health care provider which countries are considered high risk. ? Your parents were born in a high-risk country, and you have not been immunized against hepatitis B (hepatitis B vaccine). ? You have HIV or AIDS. ? You use needles to inject street drugs. ? You live with someone who has hepatitis B. ? You have had sex with someone who has hepatitis B. ? You get hemodialysis treatment. ? You take certain medicines for conditions, including cancer, organ transplantation, and autoimmune conditions.  Hepatitis C  Blood testing is recommended for: ? Everyone born from [birth year] through 1967. ? Anyone with known risk factors for hepatitis C.  Sexually transmitted infections (STIs)  You should be screened for sexually transmitted infections (STIs) including gonorrhea and chlamydia if: ? You are sexually active and are younger than 38 years of age. ? You are older than 38 years of age and your health care provider tells you that you are at risk for this type of infection. ? Your sexual activity has changed since you were last screened and you are at an increased risk for chlamydia or gonorrhea. Ask your health care provider if you are at risk.  If you do not have HIV, but are at risk, it may be recommended that you take a prescription medicine daily to prevent HIV infection. This is called pre-exposure prophylaxis (PrEP). You are considered at risk if: ? You are sexually active and do not regularly use condoms or know the HIV status of your partner(s). ? You take drugs by injection. ? You are  sexually active with a partner who has HIV.  Talk with your health care provider about whether you are at high risk of being infected with HIV. If you choose to begin PrEP, you should first be tested for HIV. You should then be tested every 3 months for as long as you are taking PrEP. Pregnancy  If you are premenopausal and you may become pregnant, ask your health care provider about preconception counseling.  If you may become  pregnant, take 400 to 800 micrograms (mcg) of folic acid every day.  If you want to prevent pregnancy, talk to your health care provider about birth control (contraception). Osteoporosis and menopause  Osteoporosis is a disease in which the bones lose minerals and strength with aging. This can result in serious bone fractures. Your risk for osteoporosis can be identified using a bone density scan.  If you are 26 years of age or older, or if you are at risk for osteoporosis and fractures, ask your health care provider if you should be screened.  Ask your health care provider whether you should take a calcium or vitamin D supplement to lower your risk for osteoporosis.  Menopause may have certain physical symptoms and risks.  Hormone replacement therapy may reduce some of these symptoms and risks. Talk to your health care provider about whether hormone replacement therapy is right for you. Follow these instructions at home:  Schedule regular health, dental, and eye exams.  Stay current with your immunizations.  Do not use any tobacco products including cigarettes, chewing tobacco, or electronic cigarettes.  If you are pregnant, do not drink alcohol.  If you are breastfeeding, limit how much and how often you drink alcohol.  Limit alcohol intake to no more than 1 drink per day for nonpregnant women. One drink equals 12 ounces of beer, 5 ounces of wine, or 1 ounces of hard liquor.  Do not use street drugs.  Do not share needles.  Ask your health care  provider for help if you need support or information about quitting drugs.  Tell your health care provider if you often feel depressed.  Tell your health care provider if you have ever been abused or do not feel safe at home. This information is not intended to replace advice given to you by your health care provider. Make sure you discuss any questions you have with your health care provider. Document Released: 03/12/2011 Document Revised: 02/02/2016 Document Reviewed: 05/31/2015 Elsevier Interactive Patient Education  2018 [employer].  Contraception Choices Contraception, also called birth control, means things to use or ways to try not to get pregnant. Hormonal birth control This kind of birth control uses hormones. Here are some types of hormonal birth control:  A tube that is put under skin of the arm (implant). The tube can stay in for as long as 3 years.  Shots to get every 3 months (injections).  Pills to take every day (birth control pills).  A patch to change 1 time each week for 3 weeks (birth control patch). After that, the patch is taken off for 1 week.  A ring to put in the vagina. The ring is left in for 3 weeks. Then it is taken out of the vagina for 1 week. Then a new ring is put in.  Pills to take after unprotected sex (emergency birth control pills).  Barrier birth control Here are some types of barrier birth control:  A thin covering that is put on the penis before sex (female condom). The covering is thrown away after sex.  A soft, loose covering that is put in the vagina before sex (female condom). The covering is thrown away after sex.  A rubber bowl that sits over the cervix (diaphragm). The bowl must be made for you. The bowl is put into the vagina before sex. The bowl is left in for 6-8 hours after sex. It is taken out within 24 hours.  A small, soft cup that fits  over the cervix (cervical cap). The cup must be made for you. The cup can be left in for 6-8  hours after sex. It is taken out within 48 hours.  A sponge that is put into the vagina before sex. It must be left in for at least 6 hours after sex. It must be taken out within 30 hours. Then it is thrown away.  A chemical that kills or stops sperm from getting into the uterus (spermicide). It may be a pill, cream, jelly, or foam to put in the vagina. The chemical should be used at least 10-15 minutes before sex.  IUD (intrauterine) birth control An IUD is a small, T-shaped piece of plastic. It is put inside the uterus. There are two kinds:  Hormone IUD. This kind can stay in for 3-5 years.  Copper IUD. This kind can stay in for 10 years.  Permanent birth control Here are some types of permanent birth control:  Surgery to block the fallopian tubes.  Having an insert put into each fallopian tube.  Surgery to tie off the tubes that carry sperm (vasectomy).  Natural planning birth control Here are some types of natural planning birth control:  Not having sex on the days the woman could get pregnant.  Using a calendar: ? To keep track of the length of each period. ? To find out what days pregnancy can happen. ? To plan to not have sex on days when pregnancy can happen.  Watching for symptoms of ovulation and not having sex during ovulation. One way the woman can check for ovulation is to check her temperature.  Waiting to have sex until after ovulation.  Summary  Contraception, also called birth control, means things to use or ways to try not to get pregnant.  Hormonal methods of birth control include implants, injections, pills, patches, vaginal rings, and emergency birth control pills.  Barrier methods of birth control can include female condoms, female condoms, diaphragms, cervical caps, sponges, and spermicides.  There are two types of IUD (intrauterine device) birth control. An IUD can be put in a woman's uterus to prevent pregnancy for 3-5 years.  Permanent  sterilization can be done through a procedure for males, females, or both.  Natural planning methods involve not having sex on the days when the woman could get pregnant. This information is not intended to replace advice given to you by your health care provider. Make sure you discuss any questions you have with your health care provider. Document Released: 06/24/2009 Document Revised: 09/06/2016 Document Reviewed: 09/06/2016 Elsevier Interactive Patient Education  2017 [employer]  

## 2018-02-05 IMAGING — US US OB TRANSVAGINAL
1 series · 15 of 28 positions shown · non-contrast
Comparison: None.

CLINICAL DATA: Initial evaluation for acute severe abdominal pain,
constipation for 3 weeks. Currently pregnant.

EXAM:
OBSTETRIC <14 WK US AND TRANSVAGINAL OB US
TECHNIQUE: Both transabdominal and transvaginal ultrasound examinations were
performed for complete evaluation of the gestation as well as the
maternal uterus, adnexal regions, and pelvic cul-de-sac.
Transvaginal technique was performed to assess early pregnancy.

[Series 1: us ob transvaginal · 15 of 61 slices shown]
[im 1/61]
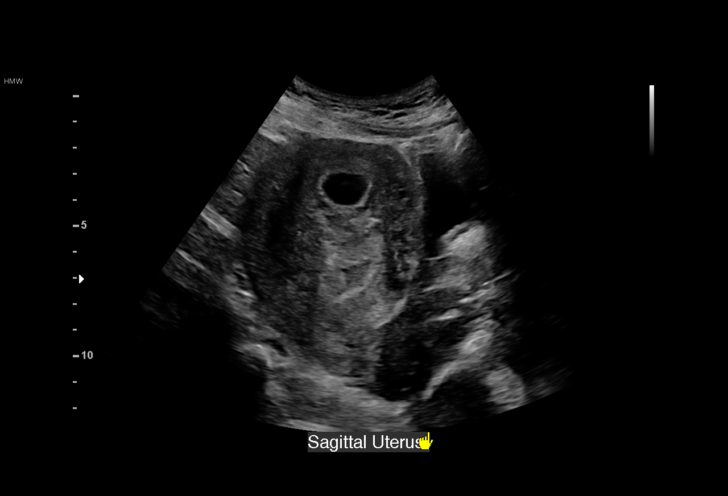
[im 5/61]
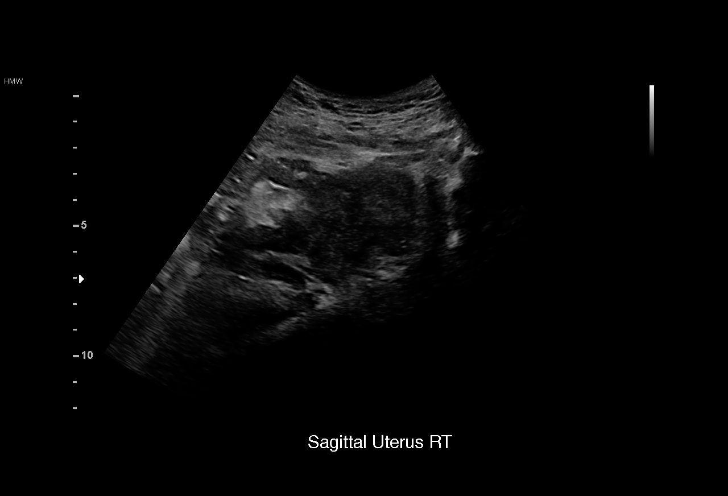
[im 9/61]
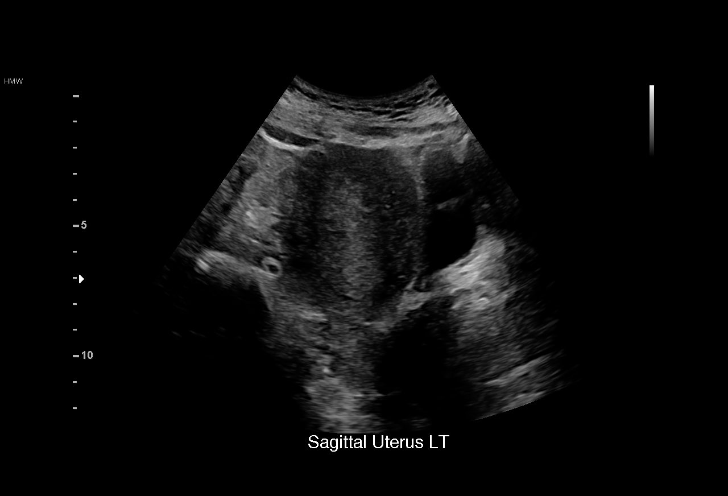
[im 14/61]
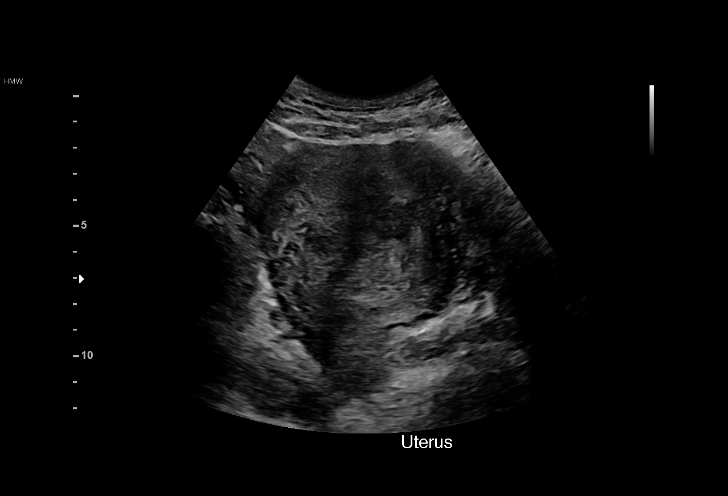
[im 18/61]
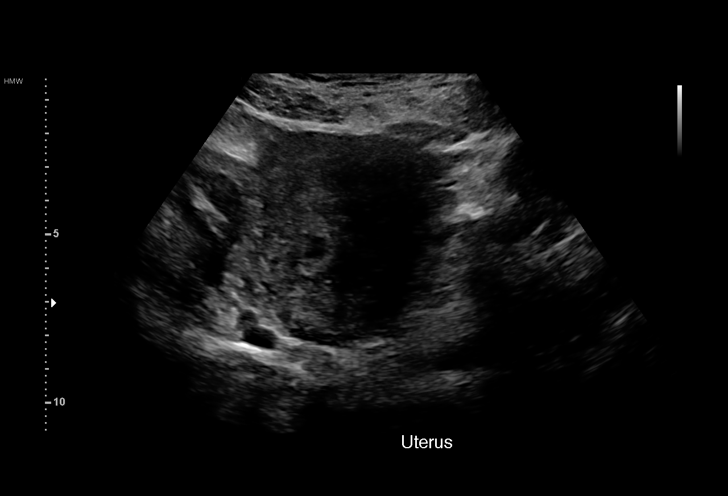
[im 23/61]
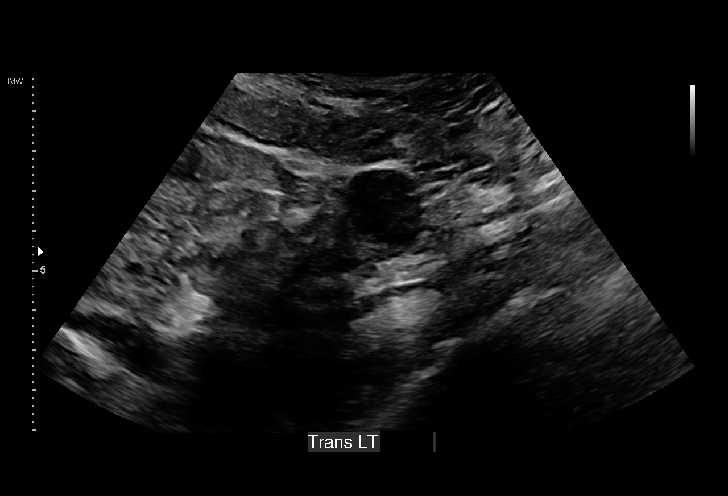
[im 27/61]
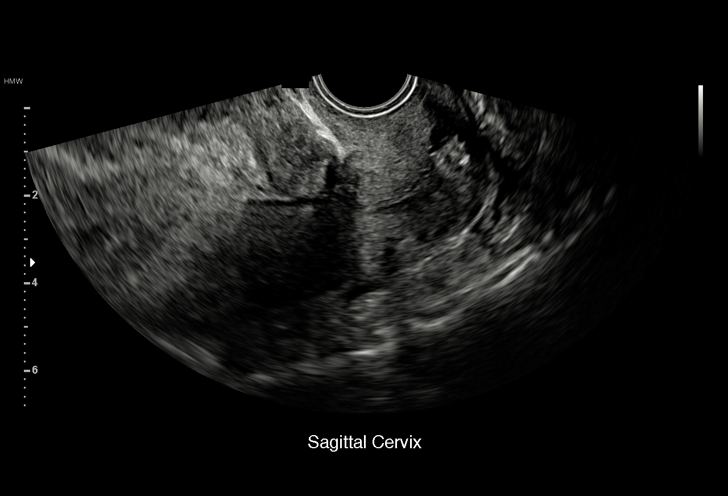
[im 32/61]
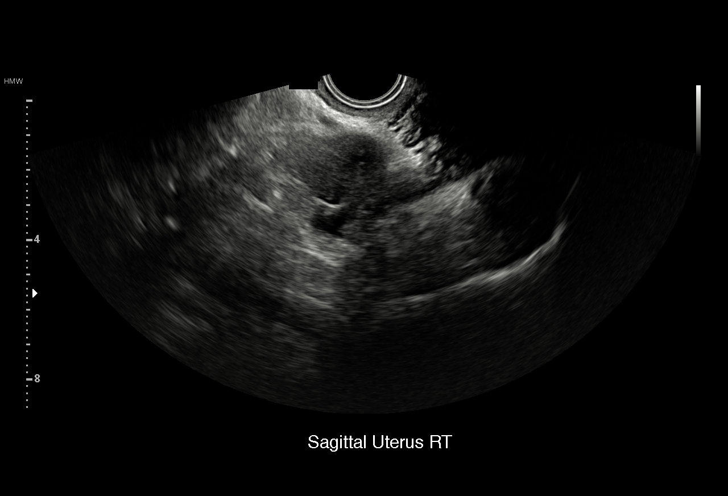
[im 34/61]
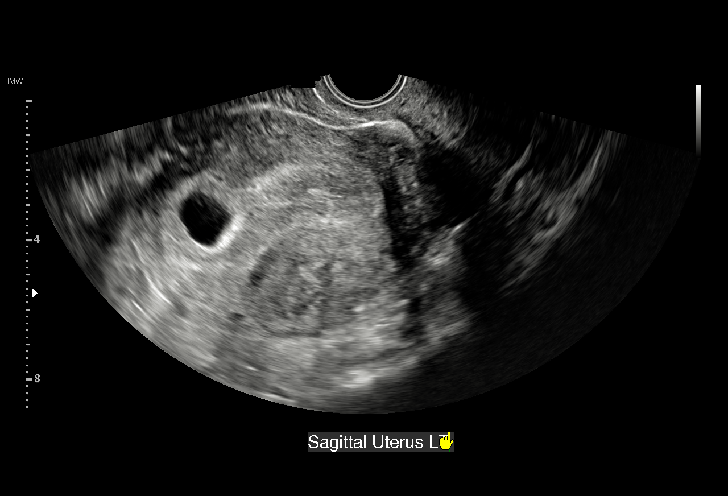
[im 38/61]
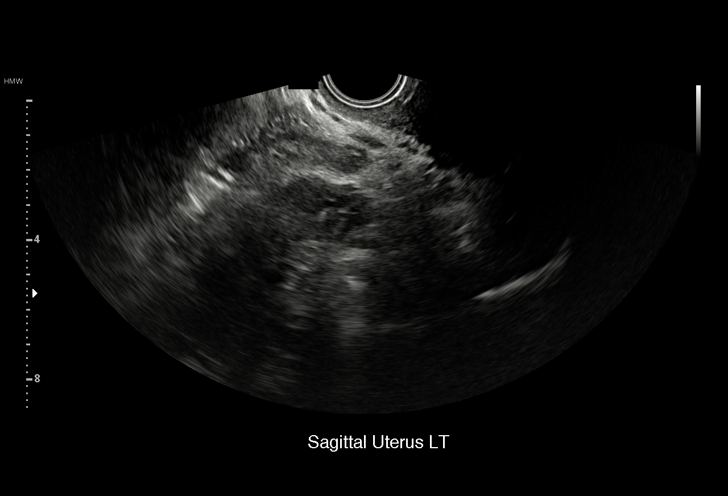
[im 43/61]
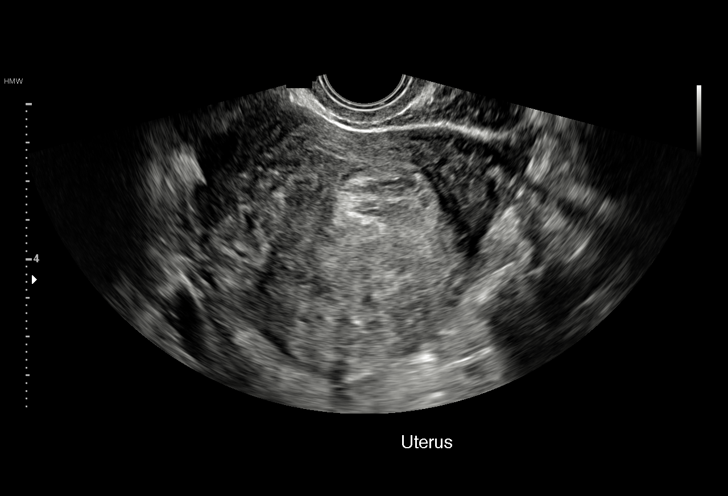
[im 47/61]
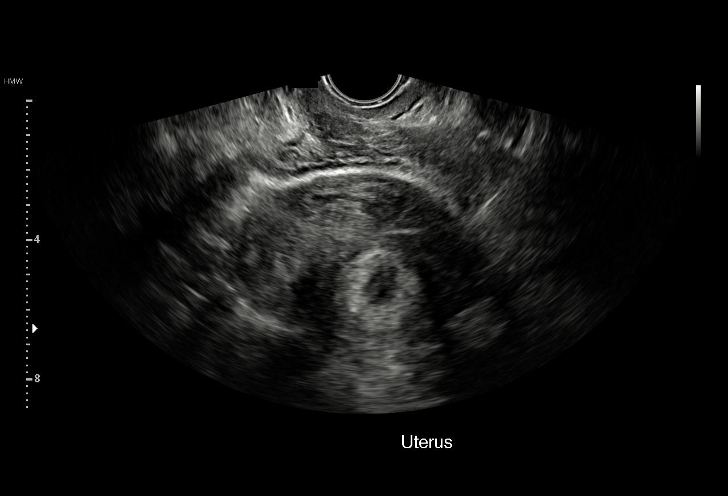
[im 52/61]
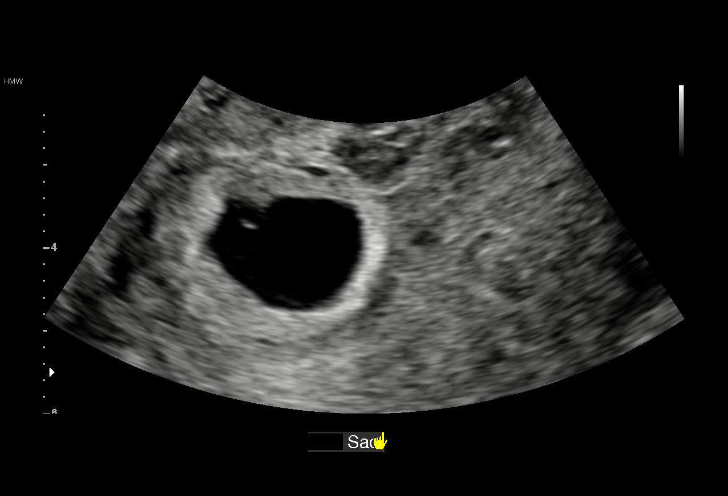
[im 56/61]
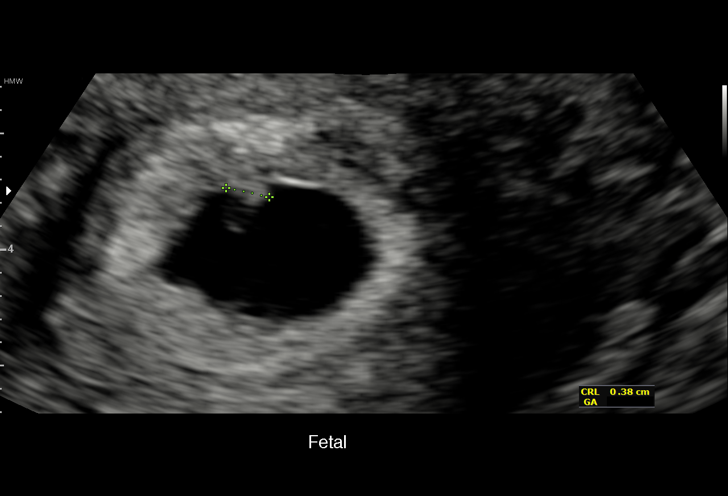
[im 61/61]
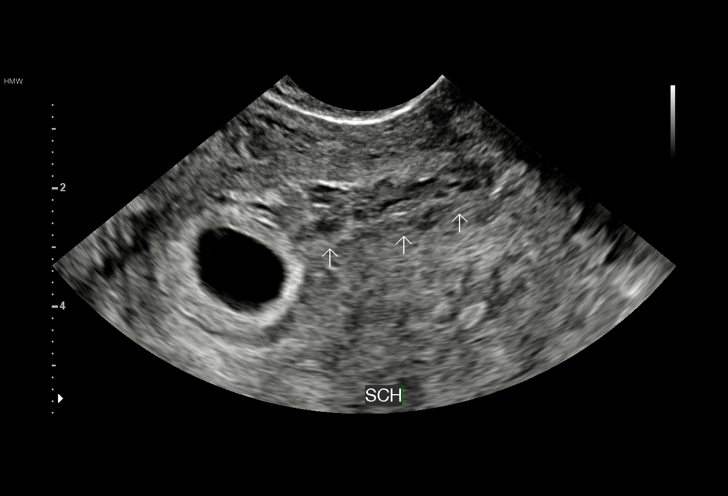

[15 of 28 positions shown; findings below may reference images not displayed]

FINDINGS: Intrauterine gestational sac: Single

Yolk sac:  Present

Embryo:  Present

Cardiac Activity: Present

Heart Rate: 111  bpm

MSD:   mm    w     d

CRL:  4.1  mm   6 w   1 d                  US EDC: 06/16/2017

Subchorionic hemorrhage: Small subchorionic hemorrhage without mass
effect.

Maternal uterus/adnexae: Right ovary not visualized. Left ovary
unremarkable. No free fluid.
IMPRESSION: 1. Single viable intrauterine pregnancy as above.
2. Small subchorionic hemorrhage without significant mass effect. No
other complication or other abnormality identified.

## 2019-10-14 ENCOUNTER — Other Ambulatory Visit: Payer: Self-pay | Admitting: Radiology

## 2019-10-14 DIAGNOSIS — N63 Unspecified lump in unspecified breast: Secondary | ICD-10-CM

## 2020-12-26 ENCOUNTER — Ambulatory Visit: Payer: 59 | Attending: Internal Medicine | Admitting: Internal Medicine

## 2020-12-26 ENCOUNTER — Encounter: Payer: Self-pay | Admitting: Internal Medicine

## 2020-12-26 ENCOUNTER — Other Ambulatory Visit: Payer: Self-pay

## 2020-12-26 VITALS — BP 121/85 | HR 80 | Resp 16 | Ht 59.0 in | Wt 191.0 lb

## 2020-12-26 DIAGNOSIS — Z6838 Body mass index (BMI) 38.0-38.9, adult: Secondary | ICD-10-CM

## 2020-12-26 DIAGNOSIS — F411 Generalized anxiety disorder: Secondary | ICD-10-CM

## 2020-12-26 DIAGNOSIS — F17200 Nicotine dependence, unspecified, uncomplicated: Secondary | ICD-10-CM

## 2020-12-26 DIAGNOSIS — Z7689 Persons encountering health services in other specified circumstances: Secondary | ICD-10-CM

## 2020-12-26 DIAGNOSIS — D649 Anemia, unspecified: Secondary | ICD-10-CM

## 2020-12-26 DIAGNOSIS — N946 Dysmenorrhea, unspecified: Secondary | ICD-10-CM | POA: Insufficient documentation

## 2020-12-26 DIAGNOSIS — F321 Major depressive disorder, single episode, moderate: Secondary | ICD-10-CM

## 2020-12-26 DIAGNOSIS — Z1159 Encounter for screening for other viral diseases: Secondary | ICD-10-CM

## 2020-12-26 DIAGNOSIS — Z114 Encounter for screening for human immunodeficiency virus [HIV]: Secondary | ICD-10-CM

## 2020-12-26 MED ORDER — PROMETHAZINE HCL 25 MG PO TABS: 25.0000 mg | ORAL_TABLET | Freq: Every day | ORAL | 0 refills | Status: DC | PRN

## 2020-12-26 MED ORDER — SERTRALINE HCL 50 MG PO TABS: ORAL_TABLET | ORAL | 1 refills | Status: DC

## 2020-12-26 NOTE — Progress Notes (Addendum)
Patient ID: NAIKA NOTO, female    DOB: 11/20/1980  MRN: [id number]  CC: New Patient (Initial Visit)   Subjective: Brianna Sampson is a 40 y.o. female who presents for new patient visit.   Her concerns today include:  Patient with history of obesity, tobacco dependence, vitamin D deficiency  Last PCP was at [facility].  Hx of dep and anxiety.  Having a lot more anxiety lately. Trying to stay busy to help combat depression.  No SI; have thoughts of not wanting to be here but does not want to kill herself.  Thoughts of hurting others at time but always able to calm herself down and no active plans to do so. Was seeing a therapist intermittently since age 7.  Has not seen one in past 5 yrs.  She was taught different coping skills like listening to music, deep breathing, pleasant smells. -Tried on several meds in past.  Lutuda was most helpful. Other meds tried were Lexapro, Prozac.  Lexapro made her feel weird.  Does not recall whether she tolerated Prozac.  No meds in 5 yrs.   -contributors to stress/anxiety being mom to 5 kids, works 3 jobs.    C.o of bad menstrual cramps where she has to call out about once a mth Bad menstrual cramps since menarch Decreased after having first 3 children but then became bad again after last child in 2018. -menses last 6-7 days; heavy 1st 4 days; passes clots.   Cramps worse the first 4 days.  Associated with N/V Takes Ibuprofen and sometimes Naprosyn.  Ibuprofen + Motrin "will kinda knot it on out." Total of about 600 mg Last  Pap was about 3 yrs ago. No abnormal paps in past. No hx of fibroids.   Tob dep:  Started at age 50.  Stopped two yrs and restarted end of 2020. Not ready to quit.   Obesity: Feels she needs to get back into exercising.  She and her fianc plans to start walking.  But the major issue for her is not eating meals on time and then eating late at which time she tends to overeat.  Past medical, surgical, family and  social history reviewed.  Patient Active Problem List   Diagnosis Date Noted  . Postpartum care and examination 07/23/2017  . Contraception management 07/23/2017  . History of C-section 05/06/2017  . Low vitamin D level 12/13/2016  . OBESITY 02/12/2008  . NICOTINE ADDICTION 02/12/2008  . DEPRESSION 02/12/2008     No current outpatient medications on file prior to visit.   No current facility-administered medications on file prior to visit.    No Known Allergies  Social History   Socioeconomic History  . Marital status: Significant Other    Spouse name: Not on file  . Number of children: 5  . Years of education: Not on file  . Highest education level: Some college, no degree  Occupational History  . 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 Smoking status: Current Every Day Smoker    Packs/day: 0.50    Types: Cigarettes  . Smokeless tobacco: Never Used  Vaping Use  . Vaping Use: Never used  Substance and Sexual Activity  . Alcohol use: Yes    Alcohol/week: 2.0 standard drinks    Types: 2 Glasses of wine per week  . Drug use: No    Types: Marijuana    Comment: lasted used 3 days ago  . Sexual activity: Yes  Other Topics Concern  . Not on file  Social History Narrative  . Not on file   Social Determinants of Health   Financial Resource Strain: Not on file  Food Insecurity: Not on file  Transportation Needs: Not on file  Physical Activity: Not on file  Stress: Not on file  Social Connections: Not on file  Intimate Partner Violence: Not on file    Family History  Problem Relation Age of Onset  . Hypertension Mother   . Diabetes Mother   . Heart disease Mother     Past Surgical History:  Procedure Laterality Date  . CESAREAN SECTION    . CHOLECYSTECTOMY  2002    ROS: Review of Systems Negative except as stated above  PHYSICAL EXAM: BP 121/85   Pulse 80   Resp 16   Ht 4\' 11"  (1.499 m)   Wt 191 lb (86.6 kg)   LMP  11/28/2020 (LMP Unknown)   SpO2 100%   BMI 38.58 kg/m   Physical Exam  General appearance - alert, well appearing, obese middle-age African-American female and in no distress Mental status - normal mood, behavior, speech, dress, motor activity, and thought processes Eyes - pupils equal and reactive, extraocular eye movements intact Neck - supple, no significant adenopathy Chest - clear to auscultation, no wheezes, rales or rhonchi, symmetric air entry Heart - normal rate, regular rhythm, normal S1, S2, no murmurs, rubs, clicks or gallops Extremities - peripheral pulses normal, no pedal edema, no clubbing or cyanosis  Depression screen PHQ 2/9 12/26/2020  Decreased Interest 3  Down, Depressed, Hopeless 3  PHQ - 2 Score 6  Altered sleeping 3  Tired, decreased energy 3  Change in appetite 3  Feeling bad or failure about yourself  3  Trouble concentrating 2  Moving slowly or fidgety/restless 2  Suicidal thoughts 1  PHQ-9 Score 23   GAD 7 : Generalized Anxiety Score 12/26/2020  Nervous, Anxious, on Edge 3  Control/stop worrying 3  Worry too much - different things 3  Trouble relaxing 3  Restless 1  Easily annoyed or irritable 3  Afraid - awful might happen 3  Total GAD 7 Score 19      CMP Latest Ref Rng & Units 10/22/2016 01/11/2010 02/04/2009  Glucose 65 - 99 mg/dL 100(H) 86 99  BUN 6 - 20 mg/dL 10 15 13   Creatinine 0.44 - 1.00 mg/dL 0.65 0.66 0.75  Sodium 135 - 145 mmol/L 139 139 142  Potassium 3.5 - 5.1 mmol/L 3.9 4.2 4.9  Chloride 101 - 111 mmol/L 106 104 106  CO2 22 - 32 mmol/L 25 24 21   Calcium 8.9 - 10.3 mg/dL 9.3 9.0 9.1  Total Protein 6.5 - 8.1 g/dL 7.3 6.2 -  Total Bilirubin 0.3 - 1.2 mg/dL 0.3 0.3 -  Alkaline Phos 38 - 126 U/L 66 76 -  AST 15 - 41 U/L 13(L) 11 -  ALT 14 - 54 U/L 10(L) <8 U/L -   Lipid Panel     Component Value Date/Time   CHOL 144 01/11/2010 2243   TRIG 44 01/11/2010 2243   HDL 45 01/11/2010 2243   CHOLHDL 3.2 Ratio 01/11/2010 2243   VLDL  9 01/11/2010 2243   LDLCALC 90 01/11/2010 2243    CBC    Component Value Date/Time   WBC 16.2 (H) 06/05/2017 0106   RBC 4.02 06/05/2017 0106   HGB 13.1 06/05/2017 0106   HGB 11.6 03/29/2017 1033   HCT 37.1 06/05/2017 0106  HCT 33.9 (L) 03/29/2017 1033   PLT 256 06/05/2017 0106   PLT 248 03/29/2017 1033   MCV 92.3 06/05/2017 0106   MCV 94 03/29/2017 1033   MCH 32.6 06/05/2017 0106   MCHC 35.3 06/05/2017 0106   RDW 14.6 06/05/2017 0106   RDW 14.8 03/29/2017 1033   LYMPHSABS 2.7 12/04/2016 1624   MONOABS 0.5 01/11/2010 2243   EOSABS 0.2 12/04/2016 1624   BASOSABS 0.0 12/04/2016 1624    ASSESSMENT AND PLAN: 1. Encounter to establish care  2. Dysmenorrhea We will plan to have her return in about 4 to 6 weeks to do her well woman exam.  If uterus feels enlarged, we will order pelvic ultrasound. I have informed patient that ibuprofen and Motrin are the same.  I recommend taking 800 mg every 8 hours as needed for menstrual cramps.  I have given some Phenergan to use as needed for nausea/vomiting during her menstrual cycles. - promethazine (PHENERGAN) 25 MG tablet; Take 1 tablet (25 mg total) by mouth daily as needed for nausea or vomiting.  Dispense: 15 tablet; Refill: 0  3. Major depressive disorder, single episode, moderate ([place]) Discussed management of depression and anxiety.  Usually with cognitive behavioral therapy plus or minus medication.  Patient willing to try an antidepressant/antianxiety medication.  I discussed Zoloft with her and she is willing to give it a try.  We will start her on 25 mg for 1 month then increase it to 50 mg.  If any worsening depression or anxiety with the medication she should stop and give me a call.  Referral submitted to behavioral health - Ambulatory referral to Psychiatry - sertraline (ZOLOFT) 50 MG tablet; 1/2 tab PO daily x 4 wks then 1 tab daily  Dispense: 30 tablet; Refill: 1  4. GAD (generalized anxiety disorder) See #3 above. She will  continue doing deep breathing exercises, pleasant imagery, listening to music to help in controlling her anxiety.  Also discussed and encourage exercise to help improve mental health. - Ambulatory referral to Psychiatry - sertraline (ZOLOFT) 50 MG tablet; 1/2 tab PO daily x 4 wks then 1 tab daily  Dispense: 30 tablet; Refill: 1  5. Class 2 severe obesity with serious comorbidity and body mass index (BMI) of 38.0 to 38.9 in adult, unspecified obesity type [facility]) Discussed and encourage healthy eating habits.  Advised to eliminate sugary drinks from the diet, cut back on portion sizes of white carbohydrates, eat more white lean meat like chicken [place] and seafood instead of red meat and incorporate fresh fruits and vegetables into the diet.  Printed information given. -Encourage at least 150 minutes/week total of moderate intensity exercise. - CBC - Comprehensive metabolic panel - Lipid panel - Hemoglobin A1c  6. Tobacco dependence Pt is current smoker. Patient advised to quit smoking. Discussed health risks associated with smoking including lung and other types of cancers, chronic lung diseases and CV risks.. Pt not ready to give trail of quitting.   Will reassess on a future visit.  7. Screening for HIV (human immunodeficiency virus) Patient agreeable to HIV screening- HIV antibody (with reflex)  8. Need for hepatitis C screening test Patient agreeable to hepatitis C screening. - Hepatitis C Antibody  Addendum 12/28/2020: Patient with moderate normocytic anemia on blood test.  We will add iron studies.   Patient was given the opportunity to ask questions.  Patient verbalized understanding of the plan and was able to repeat key elements of the plan.   Orders Placed This  Encounter  Procedures  . CBC  . Comprehensive metabolic panel  . Lipid panel  . HIV antibody (with reflex)  . Hepatitis C Antibody  . Hemoglobin A1c  . Ambulatory referral to Psychiatry     Requested  Prescriptions   Signed Prescriptions Disp Refills  . sertraline (ZOLOFT) 50 MG tablet 30 tablet 1    Sig: 1/2 tab PO daily x 4 wks then 1 tab daily  . promethazine (PHENERGAN) 25 MG tablet 15 tablet 0    Sig: Take 1 tablet (25 mg total) by mouth daily as needed for nausea or vomiting.    Return in about 6 weeks (around 02/06/2021) for f/u depression/anxiety and for PAP.  Karle Plumber, MD, FACP

## 2020-12-26 NOTE — Patient Instructions (Signed)
Start Zoloft as discussed today.  If you have any worsening depression or anxiety on the medication please stop the medicine and let us know. I have referred you to behavioral health.  You will be called with this appointment.  Take ibuprofen 800 mg every 8 hours as needed for painful menstrual cramps.  I have prescribed a medication called Phenergan to use as needed for nausea/vomiting during your menstrual cycle.   Healthy Eating Following a healthy eating pattern may help you to achieve and maintain a healthy body weight, reduce the risk of chronic disease, and live a long and productive life. It is important to follow a healthy eating pattern at an appropriate calorie level for your body. Your nutritional needs should be met primarily through food by choosing a variety of nutrient-rich foods. What are tips for following this plan? Reading food labels  Read labels and choose the following: ? Reduced or low sodium. ? Juices with 100% fruit juice. ? Foods with low saturated fats and high polyunsaturated and monounsaturated fats. ? Foods with whole grains, such as whole wheat, cracked wheat, brown rice, and wild rice. ? Whole grains that are fortified with folic acid. This is recommended for women who are pregnant or who want to become pregnant.  Read labels and avoid the following: ? Foods with a lot of added sugars. These include foods that contain brown sugar, corn sweetener, corn syrup, dextrose, fructose, glucose, high-fructose corn syrup, honey, invert sugar, lactose, malt syrup, maltose, molasses, raw sugar, sucrose, trehalose, or turbinado sugar.  Do not eat more than the following amounts of added sugar per day:  6 teaspoons (25 g) for women.  9 teaspoons (38 g) for men. ? Foods that contain processed or refined starches and grains. ? Refined grain products, such as white flour, degermed cornmeal, white bread, and white rice. Shopping  Choose nutrient-rich snacks, such as  vegetables, whole fruits, and nuts. Avoid high-calorie and high-sugar snacks, such as potato chips, fruit snacks, and candy.  Use oil-based dressings and spreads on foods instead of solid fats such as butter, stick margarine, or cream cheese.  Limit pre-made sauces, mixes, and "instant" products such as flavored rice, instant noodles, and ready-made pasta.  Try more plant-protein sources, such as tofu, tempeh, black beans, edamame, lentils, nuts, and seeds.  Explore eating plans such as the Mediterranean diet or vegetarian diet. Cooking  Use oil to saut or stir-fry foods instead of solid fats such as butter, stick margarine, or lard.  Try baking, boiling, grilling, or broiling instead of frying.  Remove the fatty part of meats before cooking.  Steam vegetables in water or broth. Meal planning  At meals, imagine dividing your plate into fourths: ? One-half of your plate is fruits and vegetables. ? One-fourth of your plate is whole grains. ? One-fourth of your plate is protein, especially lean meats, poultry, eggs, tofu, beans, or nuts.  Include low-fat dairy as part of your daily diet.   Lifestyle  Choose healthy options in all settings, including home, work, school, restaurants, or stores.  Prepare your food safely: ? Wash your hands after handling raw meats. ? Keep food preparation surfaces clean by regularly washing with hot, soapy water. ? Keep raw meats separate from ready-to-eat foods, such as fruits and vegetables. ? Cook seafood, meat, poultry, and eggs to the recommended internal temperature. ? Store foods at safe temperatures. In general:  Keep cold foods at 60F (4.4C) or below.  Keep hot foods at 160F (60C)  or above.  Keep your freezer at [facility] (-17.8C) or below.  Foods are no longer safe to eat when they have been between the temperatures of 40-140F (4.4-60C) for more than 2 hours. What foods should I eat? Fruits Aim to eat 2 cup-equivalents of fresh,  canned (in natural juice), or frozen fruits each day. Examples of 1 cup-equivalent of fruit include 1 small apple, 8 large strawberries, 1 cup canned fruit,  cup dried fruit, or 1 cup 100% juice. Vegetables Aim to eat 2-3 cup-equivalents of fresh and frozen vegetables each day, including different varieties and colors. Examples of 1 cup-equivalent of vegetables include 2 medium carrots, 2 cups raw, leafy greens, 1 cup chopped vegetable (raw or cooked), or 1 medium baked potato. Grains Aim to eat 6 ounce-equivalents of whole grains each day. Examples of 1 ounce-equivalent of grains include 1 slice of bread, 1 cup ready-to-eat cereal, 3 cups popcorn, or  cup cooked rice, pasta, or cereal. Meats and other proteins Aim to eat 5-6 ounce-equivalents of protein each day. Examples of 1 ounce-equivalent of protein include 1 egg, 1/2 cup nuts or seeds, or 1 tablespoon (16 g) peanut butter. A cut of meat or fish that is the size of a deck of cards is about 3-4 ounce-equivalents.  Of the protein you eat each week, try to have at least 8 ounces come from seafood. This includes salmon, trout, herring, and anchovies. Dairy Aim to eat 3 cup-equivalents of fat-free or low-fat dairy each day. Examples of 1 cup-equivalent of dairy include 1 cup (240 mL) milk, 8 ounces (250 g) yogurt, 1 ounces (44 g) natural cheese, or 1 cup (240 mL) fortified soy milk. Fats and oils  Aim for about 5 teaspoons (21 g) per day. Choose monounsaturated fats, such as canola and olive oils, avocados, peanut butter, and most nuts, or polyunsaturated fats, such as sunflower, corn, and soybean oils, walnuts, pine nuts, sesame seeds, sunflower seeds, and flaxseed. Beverages  Aim for six 8-oz glasses of water per day. Limit coffee to three to five 8-oz cups per day.  Limit caffeinated beverages that have added calories, such as soda and energy drinks.  Limit alcohol intake to no more than 1 drink a day for nonpregnant women and 2 drinks a  day for men. One drink equals 12 oz of beer (355 mL), 5 oz of wine (148 mL), or 1 oz of hard liquor (44 mL). Seasoning and other foods  Avoid adding excess amounts of salt to your foods. Try flavoring foods with herbs and spices instead of salt.  Avoid adding sugar to foods.  Try using oil-based dressings, sauces, and spreads instead of solid fats. This information is based on general [place] nutrition guidelines. For more information, visit [employer]. Exact amounts may vary based on your nutrition needs. Summary  A healthy eating plan may help you to maintain a healthy weight, reduce the risk of chronic diseases, and stay active throughout your life.  Plan your meals. Make sure you eat the right portions of a variety of nutrient-rich foods.  Try baking, boiling, grilling, or broiling instead of frying.  Choose healthy options in all settings, including home, work, school, restaurants, or stores. This information is not intended to replace advice given to you by your health care provider. Make sure you discuss any questions you have with your health care provider. Document Revised: 12/09/2017 Document Reviewed: 12/09/2017 Elsevier Patient Education  [place].

## 2020-12-27 LAB — LIPID PANEL
Chol/HDL Ratio: 3.6 ratio (ref 0.0–4.4)
Cholesterol, Total: 167 mg/dL (ref 100–199)
HDL: 46 mg/dL (ref >39)
LDL Chol Calc (NIH): 109 mg/dL — ABNORMAL HIGH (ref 0–99)
Triglycerides: 62 mg/dL (ref 0–149)
VLDL Cholesterol Cal: 12 mg/dL (ref 5–40)

## 2020-12-27 LAB — COMPREHENSIVE METABOLIC PANEL
ALT: 6 IU/L (ref 0–32)
AST: 9 IU/L (ref 0–40)
Albumin/Globulin Ratio: 1.5 (ref 1.2–2.2)
Albumin: 4.3 g/dL (ref 3.8–4.8)
Alkaline Phosphatase: 87 IU/L (ref 44–121)
BUN/Creatinine Ratio: 16 (ref 9–23)
BUN: 12 mg/dL (ref 6–24)
Bilirubin Total: <0.2 mg/dL (ref 0.0–1.2)
CO2: 18 mmol/L — ABNORMAL LOW (ref 20–29)
Calcium: 9.2 mg/dL (ref 8.7–10.2)
Chloride: 106 mmol/L (ref 96–106)
Creatinine, Ser: 0.75 mg/dL (ref 0.57–1.00)
Globulin, Total: 2.8 g/dL (ref 1.5–4.5)
Glucose: 85 mg/dL (ref 65–99)
Potassium: 4.9 mmol/L (ref 3.5–5.2)
Sodium: 137 mmol/L (ref 134–144)
Total Protein: 7.1 g/dL (ref 6.0–8.5)
eGFR: 103 mL/min/{1.73_m2} (ref >59)

## 2020-12-27 LAB — HEMOGLOBIN A1C
Est. average glucose Bld gHb Est-mCnc: 111 mg/dL
Hgb A1c MFr Bld: 5.5 % (ref 4.8–5.6)

## 2020-12-27 LAB — CBC
Hematocrit: 34.1 % (ref 34.0–46.6)
Hemoglobin: 10.7 g/dL — ABNORMAL LOW (ref 11.1–15.9)
MCH: 27 pg (ref 26.6–33.0)
MCHC: 31.4 g/dL — ABNORMAL LOW (ref 31.5–35.7)
MCV: 86 fL (ref 79–97)
Platelets: 349 10*3/uL (ref 150–450)
RBC: 3.97 x10E6/uL (ref 3.77–5.28)
RDW: 16.7 % — ABNORMAL HIGH (ref 11.7–15.4)
WBC: 9.8 10*3/uL (ref 3.4–10.8)

## 2020-12-27 LAB — HIV ANTIBODY (ROUTINE TESTING W REFLEX): HIV Screen 4th Generation wRfx: NONREACTIVE

## 2020-12-27 LAB — HEPATITIS C ANTIBODY: Hep C Virus Ab: <0.1 s/co ratio (ref 0.0–0.9)

## 2020-12-28 NOTE — Addendum Note (Signed)
Addended by: Karle Plumber B on: 12/28/2020 10:10 AM   Modules accepted: Orders

## 2020-12-28 NOTE — Progress Notes (Signed)
Let patient know that she has mild anemia.  We will have lab add iron studies to see whether this is iron deficiency and will get back to her with results.  I suspect that it is given her heavy menstrual cycles.  Kidney and liver function tests are normal.  LDL cholesterol is mildly elevated at 109 with goal being less than 100.  Healthy eating habits and regular exercise will help to lower cholesterol.  Screen for HIV and hepatitis C are negative.  Screening test for diabetes is normal.

## 2020-12-29 ENCOUNTER — Other Ambulatory Visit: Payer: Self-pay | Admitting: Internal Medicine

## 2020-12-29 DIAGNOSIS — D500 Iron deficiency anemia secondary to blood loss (chronic): Secondary | ICD-10-CM

## 2020-12-29 DIAGNOSIS — D509 Iron deficiency anemia, unspecified: Secondary | ICD-10-CM | POA: Insufficient documentation

## 2020-12-29 DIAGNOSIS — Z862 Personal history of diseases of the blood and blood-forming organs and certain disorders involving the immune mechanism: Secondary | ICD-10-CM | POA: Insufficient documentation

## 2020-12-29 LAB — IRON,TIBC AND FERRITIN PANEL
Ferritin: 10 ng/mL — ABNORMAL LOW (ref 15–150)
Iron Saturation: 7 % — CL (ref 15–55)
Iron: 29 ug/dL (ref 27–159)
Total Iron Binding Capacity: 403 ug/dL (ref 250–450)
UIBC: 374 ug/dL (ref 131–425)

## 2020-12-29 LAB — SPECIMEN STATUS REPORT

## 2020-12-29 MED ORDER — FERROUS SULFATE 325 (65 FE) MG PO TABS: 325.0000 mg | ORAL_TABLET | Freq: Every day | ORAL | 1 refills | Status: DC

## 2020-12-29 NOTE — Progress Notes (Signed)
iron

## 2020-12-29 NOTE — Progress Notes (Signed)
She has iron deficiency anemia.  She should start taking iron supplement once daily.  Prescription sent to her pharmacy.

## 2021-02-13 ENCOUNTER — Other Ambulatory Visit: Payer: Self-pay

## 2021-02-13 ENCOUNTER — Ambulatory Visit: Payer: 59 | Attending: Internal Medicine | Admitting: Internal Medicine

## 2021-02-13 DIAGNOSIS — N946 Dysmenorrhea, unspecified: Secondary | ICD-10-CM | POA: Diagnosis not present

## 2021-02-13 DIAGNOSIS — F411 Generalized anxiety disorder: Secondary | ICD-10-CM | POA: Diagnosis not present

## 2021-02-13 DIAGNOSIS — F330 Major depressive disorder, recurrent, mild: Secondary | ICD-10-CM | POA: Diagnosis not present

## 2021-02-13 DIAGNOSIS — N920 Excessive and frequent menstruation with regular cycle: Secondary | ICD-10-CM

## 2021-02-13 DIAGNOSIS — D500 Iron deficiency anemia secondary to blood loss (chronic): Secondary | ICD-10-CM

## 2021-02-13 MED ORDER — HYDROXYZINE PAMOATE 25 MG PO CAPS: 25.0000 mg | ORAL_CAPSULE | Freq: Two times a day (BID) | ORAL | 1 refills | Status: DC | PRN

## 2021-02-13 MED ORDER — SERTRALINE HCL 50 MG PO TABS: ORAL_TABLET | ORAL | 1 refills | Status: DC

## 2021-02-13 NOTE — Progress Notes (Signed)
Virtual Visit via Video Note  I connected with Brianna Sampson on 02/13/21 at 10:10 AM EDT by a video enabled telemedicine application and verified that I am speaking with the correct person using two identifiers.  Location: Patient: in her car parked Provider: office   I discussed the limitations of evaluation and management by telemedicine and the availability of in person appointments. The patient expressed understanding and agreed to proceed.  History of Present Illness: Patient with history of tobacco dependence, obesity, depression/anxiety, dysmenorrhea.  Last seen 12/2020.  Purpose of today's visit was 6 weeks follow-up for depression/anxiety.  This was supposed to be an in person visit but due to shortage of nursing staff we had to converted to a virtual visit.  Depression/anxiety: Since last visit patient reports that her depression is much improved on the Zoloft.  However she feels more anxious on the medication especially with the recent increase from half a tablet daily to the full 50 mg tablet.  She quit one of her jobs recently because she always felt anxious going into this particular clients home.  When she does going she states that they would argue a lot so she decided to quit back client.  She has an appointment coming up with [place] [facility] on July 7 and 11.  On last visit she complained of dysmenorrhea with heavy cycles.  She was found to be anemic on CBC and iron studies consistent with iron deficiency anemia.  Recommended starting iron supplement which she has started taking once a day.  - We had planned to do her Pap smear today but again visit had to be changed to virtual.  We will plan to do her Pap smear on next visit with me in 5 weeks.  Outpatient Encounter Medications as of 02/13/2021  Medication Sig  . hydrOXYzine (VISTARIL) 25 MG capsule Take 1 capsule (25 mg total) by mouth 2 (two) times daily as needed for anxiety. Medication can cause drowsiness   . ferrous sulfate 325 (65 FE) MG tablet Take 1 tablet (325 mg total) by mouth daily with breakfast.  . promethazine (PHENERGAN) 25 MG tablet Take 1 tablet (25 mg total) by mouth daily as needed for nausea or vomiting.  . sertraline (ZOLOFT) 50 MG tablet 1 tab PO daily  . [DISCONTINUED] sertraline (ZOLOFT) 50 MG tablet 1/2 tab PO daily x 4 wks then 1 tab daily   No facility-administered encounter medications on file as of 02/13/2021.    Observations/Objective: Results for orders placed or performed in visit on 12/26/20  CBC  Result Value Ref Range   WBC 9.8 3.4 - 10.8 x10E3/uL   RBC 3.97 3.77 - 5.28 x10E6/uL   Hemoglobin 10.7 (L) 11.1 - 15.9 g/dL   Hematocrit 34.1 34.0 - 46.6 %   MCV 86 79 - 97 fL   MCH 27.0 26.6 - 33.0 pg   MCHC 31.4 (L) 31.5 - 35.7 g/dL   RDW 16.7 (H) 11.7 - 15.4 %   Platelets 349 150 - 450 x10E3/uL  Comprehensive metabolic panel  Result Value Ref Range   Glucose 85 65 - 99 mg/dL   BUN 12 6 - 24 mg/dL   Creatinine, Ser 0.75 0.57 - 1.00 mg/dL   eGFR 103 >59 mL/min/1.73   BUN/Creatinine Ratio 16 9 - 23   Sodium 137 134 - 144 mmol/L   Potassium 4.9 3.5 - 5.2 mmol/L   Chloride 106 96 - 106 mmol/L   CO2 18 (L) 20 - 29 mmol/L     Calcium 9.2 8.7 - 10.2 mg/dL   Total Protein 7.1 6.0 - 8.5 g/dL   Albumin 4.3 3.8 - 4.8 g/dL   Globulin, Total 2.8 1.5 - 4.5 g/dL   Albumin/Globulin Ratio 1.5 1.2 - 2.2   Bilirubin Total <0.2 0.0 - 1.2 mg/dL   Alkaline Phosphatase 87 44 - 121 IU/L   AST 9 0 - 40 IU/L   ALT 6 0 - 32 IU/L  Lipid panel  Result Value Ref Range   Cholesterol, Total 167 100 - 199 mg/dL   Triglycerides 62 0 - 149 mg/dL   HDL 46 >39 mg/dL   VLDL Cholesterol Cal 12 5 - 40 mg/dL   LDL Chol Calc (NIH) 109 (H) 0 - 99 mg/dL   Chol/HDL Ratio 3.6 0.0 - 4.4 ratio  HIV antibody (with reflex)  Result Value Ref Range   HIV Screen 4th Generation wRfx Non Reactive Non Reactive  Hepatitis C Antibody  Result Value Ref Range   Hep C Virus Ab <0.1 0.0 - 0.9 s/co ratio   Hemoglobin A1c  Result Value Ref Range   Hgb A1c MFr Bld 5.5 4.8 - 5.6 %   Est. average glucose Bld gHb Est-mCnc 111 mg/dL  Iron, TIBC and Ferritin Panel  Result Value Ref Range   Total Iron Binding Capacity 403 250 - 450 ug/dL   UIBC 374 131 - 425 ug/dL   Iron 29 27 - 159 ug/dL   Iron Saturation 7 (LL) 15 - 55 %   Ferritin 10 (L) 15 - 150 ng/mL  Specimen status report  Result Value Ref Range   specimen status report Comment      Assessment and Plan: 1. GAD (generalized anxiety disorder) Patient reports depression has improved on the Zoloft but she feels the anxiety has not.  SSRIs can cause increased anxiety/activating symptoms.  I will try her with low-dose of hydroxyzine to use as needed for several weeks with the Zoloft to help to lessen the anxiety.  I have informed her that the medication can cause drowsiness.  I recommend that she takes the first dose at home to see how it makes her feel.  If no improvement with the hydroxyzine, I asked that she gives me a call back so that we can try a different medication.  Keep appointment with behavioral health next month. - sertraline (ZOLOFT) 50 MG tablet; 1 tab PO daily  Dispense: 30 tablet; Refill: 1 - hydrOXYzine (VISTARIL) 25 MG capsule; Take 1 capsule (25 mg total) by mouth 2 (two) times daily as needed for anxiety. Medication can cause drowsiness  Dispense: 30 capsule; Refill: 1  2. Major depressive disorder, mild recurrent (HCC) See #1 above - sertraline (ZOLOFT) 50 MG tablet; 1 tab PO daily  Dispense: 30 tablet; Refill: 1  3. Iron deficiency anemia due to chronic blood loss Continue iron supplement  4. Dysmenorrhea 5. Menorrhagia with regular cycle We will do a pelvic ultrasound to evaluate for polyps or fibroids.  Keep upcoming appointment with me for Pap smear. Message sent to my CMA to schedule pelvic ultrasound. - US Pelvic Complete With Transvaginal; Future   Follow Up Instructions: 5 wks   I discussed the  assessment and treatment plan with the patient. The patient was provided an opportunity to ask questions and all were answered. The patient agreed with the plan and demonstrated an understanding of the instructions.   The patient was advised to call back or seek an in-person evaluation if the symptoms worsen or if  the condition fails to improve as anticipated.  I spent 16 minutes dedicated to the care of this patient on the date of this encounter to include previsit review of my previous notes and lab results, face-to-face time with the patient and postvisit ordering of testing.     , MD   

## 2021-02-17 ENCOUNTER — Telehealth: Payer: Self-pay

## 2021-02-17 NOTE — Telephone Encounter (Signed)
Contacted pt to go over appointment info for [place] pt didn't answer left a detailed vm making pt aware   February 27, 2021 at 230pm at [facility]. Pt is to arrive at 215pm. Pt is to have a full bladder

## 2021-02-27 ENCOUNTER — Other Ambulatory Visit: Payer: Self-pay

## 2021-02-27 ENCOUNTER — Ambulatory Visit (HOSPITAL_COMMUNITY)
Admission: RE | Admit: 2021-02-27 | Discharge: 2021-02-27 | Disposition: A | Discharge status: Home / Self Care | Payer: 59 | Source: Ambulatory Visit | Attending: Internal Medicine | Admitting: Internal Medicine

## 2021-02-27 DIAGNOSIS — N946 Dysmenorrhea, unspecified: Secondary | ICD-10-CM | POA: Diagnosis not present

## 2021-02-27 DIAGNOSIS — N920 Excessive and frequent menstruation with regular cycle: Secondary | ICD-10-CM | POA: Insufficient documentation

## 2021-02-28 ENCOUNTER — Other Ambulatory Visit: Payer: Self-pay | Admitting: Internal Medicine

## 2021-02-28 DIAGNOSIS — D259 Leiomyoma of uterus, unspecified: Secondary | ICD-10-CM

## 2021-02-28 DIAGNOSIS — N946 Dysmenorrhea, unspecified: Secondary | ICD-10-CM

## 2021-03-02 ENCOUNTER — Telehealth: Payer: Self-pay

## 2021-03-02 ENCOUNTER — Ambulatory Visit: Payer: Self-pay | Admitting: *Deleted

## 2021-03-02 NOTE — Telephone Encounter (Signed)
Pt called in and was given her lab result message from Dr. Karle Plumber dated 12/28/2020 at 10:08 AM.  She verbalized understanding to start taking iron daily and that the rx had been sent to the pharmacy.

## 2021-03-02 NOTE — Telephone Encounter (Signed)
Contacted pt to go over [place] results pt didn't answer lvm

## 2021-03-16 ENCOUNTER — Encounter (HOSPITAL_COMMUNITY): Payer: Self-pay | Admitting: Licensed Clinical Social Worker

## 2021-03-16 ENCOUNTER — Other Ambulatory Visit: Payer: Self-pay

## 2021-03-16 ENCOUNTER — Ambulatory Visit (INDEPENDENT_AMBULATORY_CARE_PROVIDER_SITE_OTHER): Payer: 59 | Admitting: Licensed Clinical Social Worker

## 2021-03-16 DIAGNOSIS — F3132 Bipolar disorder, current episode depressed, moderate: Secondary | ICD-10-CM | POA: Diagnosis not present
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
## 2021-03-20 ENCOUNTER — Ambulatory Visit (INDEPENDENT_AMBULATORY_CARE_PROVIDER_SITE_OTHER): Payer: 59 | Admitting: Psychiatry

## 2021-03-20 ENCOUNTER — Encounter (HOSPITAL_COMMUNITY): Payer: Self-pay

## 2021-03-20 VITALS — BP 120/80 | Ht 59.0 in | Wt 181.0 lb

## 2021-03-20 DIAGNOSIS — F330 Major depressive disorder, recurrent, mild: Secondary | ICD-10-CM | POA: Diagnosis not present

## 2021-03-20 DIAGNOSIS — F3132 Bipolar disorder, current episode depressed, moderate: Secondary | ICD-10-CM | POA: Diagnosis not present

## 2021-03-20 DIAGNOSIS — F411 Generalized anxiety disorder: Secondary | ICD-10-CM | POA: Diagnosis not present

## 2021-03-20 MED ORDER — LATUDA 20 MG PO TABS: 20.0000 mg | ORAL_TABLET | Freq: Every day | ORAL | 1 refills | Status: DC

## 2021-03-20 MED ORDER — HYDROXYZINE PAMOATE 25 MG PO CAPS: 25.0000 mg | ORAL_CAPSULE | Freq: Two times a day (BID) | ORAL | 1 refills | Status: DC | PRN

## 2021-03-20 MED ORDER — TRAZODONE HCL 50 MG PO TABS: 25.0000 mg | ORAL_TABLET | Freq: Every day | ORAL | 2 refills | Status: DC

## 2021-03-20 MED ORDER — SERTRALINE HCL 50 MG PO TABS: ORAL_TABLET | ORAL | 1 refills | Status: DC
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
## 2021-03-23 ENCOUNTER — Ambulatory Visit: Payer: 59 | Admitting: Internal Medicine

## 2021-04-13 ENCOUNTER — Ambulatory Visit (INDEPENDENT_AMBULATORY_CARE_PROVIDER_SITE_OTHER): Payer: 59 | Admitting: Licensed Clinical Social Worker

## 2021-04-13 DIAGNOSIS — F3132 Bipolar disorder, current episode depressed, moderate: Secondary | ICD-10-CM | POA: Diagnosis not present

## 2021-04-13 NOTE — Progress Notes (Signed)
   THERAPIST PROGRESS NOTE  Session Time: 6  Participation Level: Active  Behavioral Response: CasualAlertAnxious and Depressed  Type of Therapy: Individual Therapy  Treatment Goals addressed: Anxiety and Coping  Interventions: CBT and Supportive  Summary: Brianna Sampson is a 40 y.o. female who presents with anxious and depressed mood/affect. She was dressed casually and engaged well in therapy session. Brianna Sampson was pleasant, cooperative, and maintained good eye contact   Pt states her primary stressors for work, family conflict, and medication management. Brianna Sampson states that her insurance does not cover her medications. Her pharmacy called her in July after it was prescribed on 7/11. Brianna Sampson.   Plan for pt is to Sampson [id number] and follow the prompt to nursing line. Pt was advised to leave a message if they did not answer. Next pt has an appointment with medication provider on 8/8 if nursing line does not get back to her.   Pt reports she is also stressed with work and family conflict. 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 Recently two of her clients passed away and she is currently transitioning to having two new clients. Pt reports other stressor as raising 5 children and struggling to cope with other life stressors. Brianna Sampson states she get overwhelmed and ill isolate herself in her room.   Plan: When pt want to isolate pt to journal for 10 to 15 minutes. This is aimed to externalize her emotions instead of internalizing them. Brianna Sampson also directed to support groups through [employer].   Suicidal/Homicidal: NAwithout intent/plan  Therapist Response:    Intervention/Plan:  Supportive and CBT was utilized. Open ended questions, and journaling was utilized for CBT. encouragement, praise, and advice giving was presented to pt. Plan is listed above.    Plan: Return again in 4 weeks.    Dory Horn,  LCSW 04/13/2021

## 2021-04-17 ENCOUNTER — Telehealth (INDEPENDENT_AMBULATORY_CARE_PROVIDER_SITE_OTHER): Payer: 59 | Admitting: Psychiatry

## 2021-04-17 ENCOUNTER — Encounter (HOSPITAL_COMMUNITY): Payer: Self-pay

## 2021-04-17 ENCOUNTER — Other Ambulatory Visit: Payer: Self-pay

## 2021-04-17 DIAGNOSIS — F411 Generalized anxiety disorder: Secondary | ICD-10-CM

## 2021-04-17 DIAGNOSIS — F3132 Bipolar disorder, current episode depressed, moderate: Secondary | ICD-10-CM | POA: Diagnosis not present

## 2021-04-17 DIAGNOSIS — F330 Major depressive disorder, recurrent, mild: Secondary | ICD-10-CM | POA: Diagnosis not present

## 2021-04-17 MED ORDER — SERTRALINE HCL 50 MG PO TABS: ORAL_TABLET | ORAL | 1 refills | Status: DC

## 2021-04-17 MED ORDER — GABAPENTIN 100 MG PO CAPS: 100.0000 mg | ORAL_CAPSULE | Freq: Three times a day (TID) | ORAL | 2 refills | Status: DC

## 2021-04-17 MED ORDER — QUETIAPINE FUMARATE 50 MG PO TABS: 50.0000 mg | ORAL_TABLET | Freq: Every day | ORAL | 1 refills | Status: DC

## 2021-04-17 NOTE — Progress Notes (Signed)
[place] NP OP Progress Notes   Patient Identification: Brianna Sampson MRN:  [id number] Date of Evaluation:  04/17/2021 Referral Source: Self Chief Complaint:   Chief Complaint   Anxiety; Follow-up; Depression    Visit Diagnosis:    ICD-10-CM   1. Bipolar 1 disorder, depressed, moderate ([place])  F31.32     2. GAD (generalized anxiety disorder)  F41.1 sertraline (ZOLOFT) 50 MG tablet    3. Major depressive disorder, recurrent episode, mild ([facility])  F33.0 sertraline (ZOLOFT) 50 MG tablet      History of Present Illness:   40 yo female with depression and anxiety, history of bipolar disorder.  Unfortunately, she was unable to get her Latuda or Trazodone filled because of her insurance.  She has been irritable and angry at times with decrease sleep and fatigue, lack of motivation.  Depression is moderate and her anxiety is high.  Appetite is fair.  No suicidal ideations or hallucinations or substance abuse.  No side effects from her Zoloft and hydroxyzine.  She does not feel the hydroxyzine is strong enough for her anxiety at work.  Discussed medications and made changes that her insurance company will hopefully accept.  Provided the number to the clinic to call if she cannot get her medications so adjustments can be made.    Past Psychiatric History: bipolar disorder, depression, and anxiety  Previous Psychotropic Medications: Yes   Past Medical History:  Past Medical History:  Diagnosis Date   Anxiety    ARTHRITIS, RIGHT KNEE 08/16/2008   Qualifier: Diagnosis of  By: Moshe Cipro MD, [place]     Depression     Past Surgical History:  Procedure Laterality Date   CESAREAN SECTION     CHOLECYSTECTOMY  2002    Family Psychiatric History: mother with bipolar d/o  Family History:  Family History  Problem Relation Age of Onset   Hypertension Mother    Diabetes Mother    Heart disease Mother     Social History:   Social History   Socioeconomic History   Marital status: Significant Other     Spouse name: Not on file   Number of children: 5   Years of education: Not on file   Highest education level: Some college, no degree  Occupational History   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 1.5 grams   Sexual activity: Yes    Partners: Male  Other Topics Concern   Not on file  Social History Narrative   Not on file   Social Determinants of Health   Financial Resource Strain: Medium Risk   Difficulty of Paying Living Expenses: Somewhat hard  Food Insecurity: No Food Insecurity   Worried About Running Out of Food in the Last Year: Never true   Ran Out of Food in the Last Year: Never true  Transportation Needs: No Transportation Needs   Lack of Transportation (Medical): No   Lack of Transportation (Non-Medical): No  Physical Activity: Sufficiently Active   Days of Exercise per Week: 7 days  Minutes of Exercise per Session: 30 min  Stress: Stress Concern Present   Feeling of Stress : Very much  Social Connections: Moderately Isolated   Frequency of Communication with Friends and Family: Twice a week   Frequency of Social Gatherings with Friends and Family: Never   Attends Religious Services: 1 to 4 times per year   Active Member of Clubs or Organizations: No   Attends Archivist Meetings: Never   Marital Status: Living with partner    Additional Social History: lives with her partner and five children  Allergies:  No Known Allergies  Metabolic Disorder Labs: Lab Results  Component Value Date   HGBA1C 5.5 12/26/2020   No results found for: PROLACTIN Lab Results  Component Value  Date   CHOL 167 12/26/2020   TRIG 62 12/26/2020   HDL 46 12/26/2020   CHOLHDL 3.6 12/26/2020   VLDL 9 01/11/2010   LDLCALC 109 (H) 12/26/2020   [place] 90 01/11/2010   Lab Results  Component Value Date   TSH <0.006 (L) 12/04/2016    Therapeutic Level Labs: No results found for: LITHIUM No results found for: CBMZ No results found for: VALPROATE  Current Medications: Current Outpatient Medications  Medication Sig Dispense Refill   gabapentin (NEURONTIN) 100 MG capsule Take 1 capsule (100 mg total) by mouth 3 (three) times daily. 90 capsule 2   QUEtiapine (SEROQUEL) 50 MG tablet Take 1 tablet (50 mg total) by mouth at bedtime. 30 tablet 1   ferrous sulfate 325 (65 FE) MG tablet Take 1 tablet (325 mg total) by mouth daily with breakfast. 100 tablet 1   hydrOXYzine (VISTARIL) 25 MG capsule Take 1 capsule (25 mg total) by mouth 2 (two) times daily as needed for anxiety. Medication can cause drowsiness 30 capsule 1   promethazine (PHENERGAN) 25 MG tablet Take 1 tablet (25 mg total) by mouth daily as needed for nausea or vomiting. 15 tablet 0   sertraline (ZOLOFT) 50 MG tablet 1 tab PO daily 30 tablet 1   No current facility-administered medications for this visit.    Musculoskeletal: Strength & Muscle Tone: within normal limits Gait & Station: normal Patient leans: N/A  Psychiatric Specialty Exam: Review of Systems  Psychiatric/Behavioral:  Positive for dysphoric mood. The patient is nervous/anxious.   All other systems reviewed and are negative.  unknown if currently breastfeeding.There is no height or weight on file to calculate BMI.  General Appearance: Casual  Eye Contact:  Good  Speech:  Normal Rate  Volume:  Normal  Mood:  Anxious and Depressed  Affect:  Congruent  Thought Process:  Coherent and Descriptions of Associations: Intact  Orientation:  Full (Time, Place, and Person)  Thought Content:  WDL and Logical  Suicidal Thoughts:  No  Homicidal Thoughts:  No   Memory:  Immediate;   Good Recent;   Good Remote;   Good  Judgement:  Good  Insight:  Good  Psychomotor Activity:  Normal  Concentration:  Concentration: Good and Attention Span: Good  Recall:  Good  Fund of Knowledge:Good  Language: Good  Akathisia:  No  Handed:  Right  AIMS (if indicated):  not done  Assets:  Communication Skills Leisure Time Physical Health Resilience Social Support  ADL's:  Intact  Cognition: WNL  Sleep:  Poor   Screenings: GAD-7    Flowsheet Row Office Visit from 12/26/2020 in [place]  Total GAD-7 Score 19      PHQ2-9  [place] Office Visit from 03/20/2021 in [facility] Counselor from 03/16/2021 in [facility] Office Visit from 12/26/2020 in [place]  PHQ-2 Total Score '6 6 6  '$ PHQ-9 Total Score '22 20 23      '$ [place] Office Visit from 03/20/2021 in [facility] Counselor from 03/16/2021 in [place] Error: Q3, 4, or 5 should not be populated when Q2 is No Low Risk       Assessment and Plan:  Bipolar affective disorder, depression, moderate to severe without psychosis: -Start Seroquel 50 mg daily at bedtime -Continued Zoloft 50 mg daily -Follow up in 2 weeks -Continue therapy  Anxiety: -Continue hydroxyzine 25 BID PRN  -Started gabapentin 100 mg TID PRN, take this or the hydroxyzine for anxiety  Insomnia: -Discontinued Trazodone 25 mg at bedtime PRN -Started Seroquel 50 mg at bedtime -Practice sleep hygiene, specifically going to be later  Virtual Visit via Video Note  I connected with Argentina Donovan on 04/17/21 at 11:30 AM EDT by a video enabled telemedicine application and verified that I am speaking with the correct person using two identifiers.  Location: Patient: home Provider: home office   I discussed the  limitations of evaluation and management by telemedicine and the availability of in person appointments. The patient expressed understanding and agreed to proceed.  Follow Up Instructions: Follow up in 2 weeks   I discussed the assessment and treatment plan with the patient. The patient was provided an opportunity to ask questions and all were answered. The patient agreed with the plan and demonstrated an understanding of the instructions.   The patient was advised to call back or seek an in-person evaluation if the symptoms worsen or if the condition fails to improve as anticipated.  I provided 20 minutes of non-face-to-face time during this encounter.   Waylan Boga, NP   Waylan Boga, NP 8/8/202211:55 AM

## 2021-04-25 ENCOUNTER — Other Ambulatory Visit: Payer: Self-pay | Admitting: Internal Medicine

## 2021-04-25 DIAGNOSIS — F411 Generalized anxiety disorder: Secondary | ICD-10-CM

## 2021-04-25 NOTE — Telephone Encounter (Signed)
  Notes to clinic:  Medication prescribed by a different provider  Review for continued use and refill    Requested Prescriptions  Pending Prescriptions Disp Refills   hydrOXYzine (VISTARIL) 25 MG capsule [Pharmacy Med Name: hydrOXYzine Pamoate 25 MG Oral Capsule] 30 capsule 0    Sig: TAKE 1 CAPSULE BY MOUTH TWICE DAILY AS NEEDED FOR ANXIETY MEDICATION  MAY  CAUSE  DROWSINESS     Ear, Nose, and Throat:  Antihistamines Passed - 04/25/2021 11:11 AM      Passed - Valid encounter within last 12 months    Recent Outpatient Visits           2 months ago GAD (generalized anxiety disorder)   [place], MD   4 months ago Encounter to establish care   [place], MD       Future Appointments             In 3 weeks Ladell Pier, MD [place]

## 2021-05-01 ENCOUNTER — Encounter (HOSPITAL_COMMUNITY): Payer: 59 | Admitting: Psychiatry

## 2021-05-11 ENCOUNTER — Ambulatory Visit (INDEPENDENT_AMBULATORY_CARE_PROVIDER_SITE_OTHER): Payer: 59 | Admitting: Licensed Clinical Social Worker

## 2021-05-11 DIAGNOSIS — F3132 Bipolar disorder, current episode depressed, moderate: Secondary | ICD-10-CM | POA: Diagnosis not present
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
## 2021-05-16 ENCOUNTER — Ambulatory Visit: Payer: 59 | Admitting: Internal Medicine

## 2021-05-18 ENCOUNTER — Other Ambulatory Visit: Payer: Self-pay

## 2021-05-18 ENCOUNTER — Emergency Department (HOSPITAL_COMMUNITY): Payer: 59

## 2021-05-18 ENCOUNTER — Emergency Department (HOSPITAL_COMMUNITY)
Admission: EM | Admit: 2021-05-18 | Discharge: 2021-05-18 | Disposition: A | Discharge status: Home / Self Care | Payer: 59 | Attending: Student | Admitting: Student

## 2021-05-18 ENCOUNTER — Encounter (HOSPITAL_COMMUNITY): Payer: Self-pay

## 2021-05-18 DIAGNOSIS — R112 Nausea with vomiting, unspecified: Secondary | ICD-10-CM | POA: Insufficient documentation

## 2021-05-18 DIAGNOSIS — N946 Dysmenorrhea, unspecified: Secondary | ICD-10-CM | POA: Diagnosis not present

## 2021-05-18 DIAGNOSIS — F17210 Nicotine dependence, cigarettes, uncomplicated: Secondary | ICD-10-CM | POA: Insufficient documentation

## 2021-05-18 DIAGNOSIS — R1013 Epigastric pain: Secondary | ICD-10-CM | POA: Diagnosis present

## 2021-05-18 DIAGNOSIS — R197 Diarrhea, unspecified: Secondary | ICD-10-CM | POA: Insufficient documentation

## 2021-05-18 LAB — URINALYSIS, ROUTINE W REFLEX MICROSCOPIC
Bacteria, UA: NONE SEEN
Bilirubin Urine: NEGATIVE
Glucose, UA: NEGATIVE mg/dL
Ketones, ur: 80 mg/dL — AB
Leukocytes,Ua: NEGATIVE
Nitrite: NEGATIVE
Protein, ur: NEGATIVE mg/dL
Specific Gravity, Urine: >1.046 — ABNORMAL HIGH (ref 1.005–1.030)
pH: 7 (ref 5.0–8.0)

## 2021-05-18 LAB — COMPREHENSIVE METABOLIC PANEL
ALT: 12 U/L (ref 0–44)
AST: 18 U/L (ref 15–41)
Albumin: 4 g/dL (ref 3.5–5.0)
Alkaline Phosphatase: 78 U/L (ref 38–126)
Anion gap: 7 (ref 5–15)
BUN: 19 mg/dL (ref 6–20)
CO2: 23 mmol/L (ref 22–32)
Calcium: 9.4 mg/dL (ref 8.9–10.3)
Chloride: 109 mmol/L (ref 98–111)
Creatinine, Ser: 0.74 mg/dL (ref 0.44–1.00)
GFR, Estimated: >60 mL/min (ref >60)
Glucose, Bld: 142 mg/dL — ABNORMAL HIGH (ref 70–99)
Potassium: 4.1 mmol/L (ref 3.5–5.1)
Sodium: 139 mmol/L (ref 135–145)
Total Bilirubin: 0.5 mg/dL (ref 0.3–1.2)
Total Protein: 7.3 g/dL (ref 6.5–8.1)

## 2021-05-18 LAB — CBC WITH DIFFERENTIAL/PLATELET
Abs Immature Granulocytes: 0.05 10*3/uL (ref 0.00–0.07)
Basophils Absolute: 0 10*3/uL (ref 0.0–0.1)
Basophils Relative: 0 %
Eosinophils Absolute: 0 10*3/uL (ref 0.0–0.5)
Eosinophils Relative: 0 %
HCT: 40.7 % (ref 36.0–46.0)
Hemoglobin: 13.5 g/dL (ref 12.0–15.0)
Immature Granulocytes: 0 %
Lymphocytes Relative: 7 %
Lymphs Abs: 0.9 10*3/uL (ref 0.7–4.0)
MCH: 31.6 pg (ref 26.0–34.0)
MCHC: 33.2 g/dL (ref 30.0–36.0)
MCV: 95.3 fL (ref 80.0–100.0)
Monocytes Absolute: 0.5 10*3/uL (ref 0.1–1.0)
Monocytes Relative: 4 %
Neutro Abs: 11.3 10*3/uL — ABNORMAL HIGH (ref 1.7–7.7)
Neutrophils Relative %: 89 %
Platelets: 307 10*3/uL (ref 150–400)
RBC: 4.27 MIL/uL (ref 3.87–5.11)
RDW: 14.7 % (ref 11.5–15.5)
WBC: 12.8 10*3/uL — ABNORMAL HIGH (ref 4.0–10.5)
nRBC: 0 % (ref 0.0–0.2)

## 2021-05-18 LAB — I-STAT BETA HCG BLOOD, ED (MC, WL, AP ONLY): I-stat hCG, quantitative: <5 m[IU]/mL (ref <5)

## 2021-05-18 MED ORDER — ONDANSETRON HCL 4 MG/2ML IJ SOLN
4.0000 mg | Freq: Once | INTRAMUSCULAR | Status: AC
  Administered 2021-05-18: 4 mg via INTRAVENOUS
  Filled 2021-05-18: qty 2

## 2021-05-18 MED ORDER — PROMETHAZINE HCL 25 MG PO TABS: 25.0000 mg | ORAL_TABLET | Freq: Four times a day (QID) | ORAL | 0 refills | Status: DC | PRN

## 2021-05-18 MED ORDER — MORPHINE SULFATE (PF) 4 MG/ML IV SOLN
4.0000 mg | Freq: Once | INTRAVENOUS | Status: AC
  Administered 2021-05-18: 4 mg via INTRAVENOUS
  Filled 2021-05-18: qty 1

## 2021-05-18 MED ORDER — IOHEXOL 350 MG/ML SOLN
75.0000 mL | Freq: Once | INTRAVENOUS | Status: AC | PRN
  Administered 2021-05-18: 75 mL via INTRAVENOUS

## 2021-05-18 MED ORDER — SODIUM CHLORIDE 0.9 % IV BOLUS
1000.0000 mL | Freq: Once | INTRAVENOUS | Status: AC
  Administered 2021-05-18: 1000 mL via INTRAVENOUS

## 2021-05-18 MED ORDER — FAMOTIDINE IN NACL 20-0.9 MG/50ML-% IV SOLN
20.0000 mg | Freq: Once | INTRAVENOUS | Status: AC
  Administered 2021-05-18: 20 mg via INTRAVENOUS
  Filled 2021-05-18: qty 50

## 2021-05-18 MED ORDER — KETOROLAC TROMETHAMINE 30 MG/ML IJ SOLN
30.0000 mg | Freq: Once | INTRAMUSCULAR | Status: AC
  Administered 2021-05-18: 30 mg via INTRAVENOUS
  Filled 2021-05-18: qty 1

## 2021-05-18 MED ORDER — FAMOTIDINE 20 MG PO TABS: 20.0000 mg | ORAL_TABLET | Freq: Two times a day (BID) | ORAL | 0 refills | Status: AC

## 2021-05-18 MED ORDER — DICYCLOMINE HCL 20 MG PO TABS: 20.0000 mg | ORAL_TABLET | Freq: Two times a day (BID) | ORAL | 0 refills | Status: AC

## 2021-05-18 MED ORDER — DICYCLOMINE HCL 10 MG/ML IM SOLN
20.0000 mg | Freq: Once | INTRAMUSCULAR | Status: AC
  Administered 2021-05-18: 20 mg via INTRAMUSCULAR
  Filled 2021-05-18: qty 2

## 2021-05-18 NOTE — Discharge Instructions (Addendum)
Take the medications as needed to help with your symptoms. Follow-up with your primary care provider. Return to the ER if you start to experience worsening pain, fever, bloody stools, lightheadedness or chest pain.

## 2021-05-18 NOTE — ED Provider Notes (Signed)
[facility] DEPT Provider Note   CSN: [id number] Arrival date & time: 05/18/21  1121     History Chief Complaint  Patient presents with   Abdominal Cramping   Emesis    Brianna Sampson is a 40 y.o. female with a past medical history of dysmenorrhea, anemia presenting to the ED with a chief complaint of abdominal cramping, epigastric pain and emesis.  She started her menstrual cycle last night and she typically does have pain associated with this.  However she woke up this morning unable to keep anything down with nausea and vomiting.  She is also having some pain in her upper abdomen.  She is having some diarrhea as well.  She denies any urinary symptoms, bloody stools, vaginal discharge or abnormal bleeding.  She has tried taking ibuprofen and an antiemetic prescribed by her PCP about 6-1/2 hours ago but continues to be symptomatic.  Prior abdominal surgeries include cholecystectomy.  She denies any cough, congestion, chest pain, fever.  No suspicious food intake.  No sick contacts with similar symptoms.  HPI     Past Medical History:  Diagnosis Date   Anxiety    ARTHRITIS, RIGHT KNEE 08/16/2008   Qualifier: Diagnosis of  By: Moshe Cipro MD, [facility]     Depression     Patient Active Problem List   Diagnosis Date Noted   Bipolar affective disorder, depressed, moderate ([place]) 03/16/2021   Iron deficiency anemia 12/29/2020   Dysmenorrhea 12/26/2020   GAD (generalized anxiety disorder) 12/26/2020   Postpartum care and examination 07/23/2017   Contraception management 07/23/2017   History of C-section 05/06/2017   Low vitamin D level 12/13/2016   OBESITY 02/12/2008   Tobacco dependence 02/12/2008    Past Surgical History:  Procedure Laterality Date   CESAREAN SECTION     CHOLECYSTECTOMY  2002     OB History     Gravida  8   Para  5   Term  5   Preterm      AB  3   Living  5      SAB  1   IAB  2   Ectopic      Multiple  0    Live Births  5           Family History  Problem Relation Age of Onset   Hypertension Mother    Diabetes Mother    Heart disease Mother     Social History   Tobacco Use   Smoking status: Every Day    Packs/day: 0.50    Types: Cigarettes   Smokeless tobacco: Never  Vaping Use   Vaping Use: Never used  Substance Use Topics   Alcohol use: Not Currently    Alcohol/week: 2.0 standard drinks    Types: 2 Glasses of wine per week    Comment: occasional   Drug use: Yes    Types: Marijuana    Comment: 1 x daily. 1.5 grams    Home Medications Prior to Admission medications   Medication Sig Start Date End Date Taking? Authorizing Provider  dicyclomine (BENTYL) 20 MG tablet Take 1 tablet (20 mg total) by mouth 2 (two) times daily. 05/18/21  Yes Sharnae Winfree, PA-C  famotidine (PEPCID) 20 MG tablet Take 1 tablet (20 mg total) by mouth 2 (two) times daily. 05/18/21  Yes Tiauna Whisnant, PA-C  gabapentin (NEURONTIN) 100 MG capsule Take 1 capsule (100 mg total) by mouth 3 (three) times daily. 04/17/21 04/17/22 Yes Waylan Boga  Y, NP  hydrOXYzine (VISTARIL) 25 MG capsule TAKE 1 CAPSULE BY MOUTH TWICE DAILY AS NEEDED FOR ANXIETY MEDICATION  MAY  CAUSE  DROWSINESS Patient taking differently: Take 25 mg by mouth 2 (two) times daily as needed for anxiety. 04/25/21  Yes Ladell Pier, MD  ibuprofen (ADVIL) 200 MG tablet Take 600 mg by mouth every 6 (six) hours as needed for fever, headache or mild pain.   Yes [provider]  naproxen sodium (ALEVE) 220 MG tablet Take 440-660 mg by mouth 2 (two) times daily as needed (headache/pain/fever).   Yes [provider]  promethazine (PHENERGAN) 25 MG tablet Take 1 tablet (25 mg total) by mouth every 6 (six) hours as needed for nausea or vomiting. 05/18/21  Yes Kempton Milne, PA-C  QUEtiapine (SEROQUEL) 50 MG tablet Take 1 tablet (50 mg total) by mouth at bedtime. 04/17/21 05/18/21 Yes Patrecia Pour, NP  sertraline (ZOLOFT) 50 MG tablet 1 tab PO  daily Patient taking differently: Take 50 mg by mouth daily. 04/17/21  Yes Patrecia Pour, NP  ferrous sulfate 325 (65 FE) MG tablet Take 1 tablet (325 mg total) by mouth daily with breakfast. Patient not taking: Reported on 05/18/2021 12/29/20   Ladell Pier, MD    Allergies    Patient has no known allergies.  Review of Systems   Review of Systems  HENT:  Negative for ear pain, rhinorrhea, sneezing and sore throat.   Respiratory:  Negative for cough, chest tightness, shortness of breath and wheezing.    Physical Exam Updated Vital Signs BP 134/85 (BP Location: Left Arm)   Pulse 62   Temp 98.1 F (36.7 C) (Oral)   Resp 18   Ht '4\' 11"'$  (1.499 m)   Wt 82 kg   LMP 05/18/2021 Comment: negative HCG blood test 05-18-2021  SpO2 100%   BMI 36.51 kg/m   Physical Exam Vitals and nursing note reviewed.  Constitutional:      General: She is not in acute distress.    Appearance: She is well-developed.  HENT:     Head: Normocephalic and atraumatic.     Nose: Nose normal.  Eyes:     General: No scleral icterus.       Right eye: No discharge.        Left eye: No discharge.     Conjunctiva/sclera: Conjunctivae normal.  Cardiovascular:     Rate and Rhythm: Regular rhythm.     Heart sounds: Normal heart sounds. No murmur heard.   No friction rub. No gallop.  Pulmonary:     Effort: Pulmonary effort is normal. No respiratory distress.     Breath sounds: Normal breath sounds.  Abdominal:     General: Bowel sounds are normal. There is no distension.     Palpations: Abdomen is soft.     Tenderness: There is abdominal tenderness (Epigastric, suprapubic). There is no guarding.  Musculoskeletal:        General: Normal range of motion.     Cervical back: Normal range of motion and neck supple.  Skin:    General: Skin is warm and dry.     Findings: No rash.  Neurological:     Mental Status: She is alert.     Motor: No abnormal muscle tone.     Coordination: Coordination normal.     ED Results / Procedures / Treatments   Labs (all labs ordered are listed, but only abnormal results are displayed) Labs Reviewed  COMPREHENSIVE METABOLIC PANEL -  Abnormal; Notable for the following components:      Result Value   Glucose, Bld 142 (*)    All other components within normal limits  CBC WITH DIFFERENTIAL/PLATELET - Abnormal; Notable for the following components:   WBC 12.8 (*)    Neutro Abs 11.3 (*)    All other components within normal limits  URINALYSIS, ROUTINE W REFLEX MICROSCOPIC - Abnormal; Notable for the following components:   Specific Gravity, Urine >1.046 (*)    Hgb urine dipstick LARGE (*)    Ketones, ur 80 (*)    All other components within normal limits  I-STAT BETA HCG BLOOD, ED ([facility], [facility], [facility] ONLY)    EKG None  Radiology CT ABDOMEN PELVIS W CONTRAST  Result Date: 05/18/2021 CLINICAL DATA:  Pelvic pain, negative beta-HCG, gyn etiology suspected EXAM: CT ABDOMEN AND PELVIS WITH CONTRAST TECHNIQUE: Multidetector CT imaging of the abdomen and pelvis was performed using the standard protocol following bolus administration of intravenous contrast. CONTRAST:  85m OMNIPAQUE IOHEXOL 350 MG/ML SOLN COMPARISON:  Pelvic ultrasound February 27, 2021 FINDINGS: Inferior chest: The lung bases are well-aerated. Hepatobiliary: Hepatic steatosis. No focal liver lesion. No intrahepatic or extrahepatic biliary ductal dilation. The gallbladder is surgically absent. Spleen: Normal in size without focal abnormality. Pancreas: No pancreatic ductal dilatation or surrounding inflammatory changes. Adrenals/Urinary Tract: Adrenal glands are unremarkable. Kidneys are normal, without renal calculi, focal lesion, or hydronephrosis. Bladder is unremarkable. Stomach/Bowel: The stomach, small bowel and large bowel are normal in caliber without abnormal wall thickening or surrounding inflammatory changes. Reproductive: Calcified fibroid. The uterine wall appears somewhat thickened. No suspicious  adnexal masses. Lymphatic: No enlarged lymph nodes in the abdomen or pelvis. Vasculature: The abdominal aorta is normal in caliber. The portal venous system is patent. Other: No abdominopelvic ascites. Musculoskeletal: No aggressive osseous lesions. The soft tissues are unremarkable. IMPRESSION: Leiomyomatous uterus which is otherwise not well evaluated by CT. No adnexal mass identified. If concern for pelvic pathology, ultrasound or MRI of the pelvis is recommended for further evaluation. No other acute findings in the abdomen or pelvis. Electronically Signed   By: YAlbin FellingM.D.   On: 05/18/2021 14:30    Procedures Procedures   Medications Ordered in ED Medications  sodium chloride 0.9 % bolus 1,000 mL (1,000 mLs Intravenous New Bag/Given 05/18/21 1306)  ondansetron (ZOFRAN) injection 4 mg (4 mg Intravenous Given 05/18/21 1302)  dicyclomine (BENTYL) injection 20 mg (20 mg Intramuscular Given 05/18/21 1302)  famotidine (PEPCID) IVPB 20 mg premix (20 mg Intravenous New Bag/Given 05/18/21 1305)  iohexol (OMNIPAQUE) 350 MG/ML injection 75 mL (75 mLs Intravenous Contrast Given 05/18/21 1352)  ketorolac (TORADOL) 30 MG/ML injection 30 mg (30 mg Intravenous Given 05/18/21 1500)  morphine 4 MG/ML injection 4 mg (4 mg Intravenous Given 05/18/21 1500)    ED Course  I have reviewed the triage vital signs and the nursing notes.  Pertinent labs & imaging results that were available during my care of the patient were reviewed by me and considered in my medical decision making (see chart for details).  Clinical Course as of 05/18/21 1517  Thu May 18, 2021  1327 WBC(!): 12.8 [HK]  1428 Ketones, ur(!): 80 [HK]    Clinical Course User Index [HK] KDelia Heady PA-C   MDM Rules/Calculators/A&P                           40year old female with a past medical history of dysmenorrhea presenting to  the ED with a chief complaint of abdominal cramping, emesis.  Has had pelvic cramping associated with her menstrual  cycle beginning yesterday which is typical for her.  However she woke up this morning with upper abdominal pain as well as nausea and emesis.  Reports having diarrhea but no bloody stools.  No urinary symptoms, vaginal discharge or abnormal bleeding.  Minimal improvement noted with NSAIDs and antiemetic about 6-1/2 hours ago.  Prior abdominal surgeries include cholecystectomy.  On exam there is tenderness of the abdomen in the epigastric and suprapubic areas without rebound or guarding.  Her vital signs are within normal limits.  Will obtain lab work, urinalysis and attempt to treat symptomatically and reassess.  Work-up significant for leukocytosis of 12.8.  hCG is negative.  CMP is unremarkable.  CT of abdomen pelvis shows fibroid uterus.  Patient states that she is aware that she has fibroids in her uterus that have caused her dysmenorrhea.  There are no other pelvic or abdominal findings that are concerning or would explain her pain.  I suspect this is dysmenorrhea from her fibroids.  She had somewhat improvement with medications but still is complaining of nausea and pain.  Will reassess after additional medications and urinalysis.  UA with ketones, no evidence of infection. On recheck pain is controlled.  She is able to tolerate p.o. intake without difficulty.  We will have her continue medications at home, follow-up with PCP.  Offered additional work-up to characterize these fibroids but states that she is aware of them.  Return precautions given.   Patient is hemodynamically stable, in NAD, and able to ambulate in the ED. Evaluation does not show pathology that would require ongoing emergent intervention or inpatient treatment. I explained the diagnosis to the patient. Pain has been managed and has no complaints prior to discharge. Patient is comfortable with above plan and is stable for discharge at this time. All questions were answered prior to disposition. Strict return precautions for returning  to the ED were discussed. Encouraged follow up with PCP.   An After Visit Summary was printed and given to the patient.   Portions of this note were generated with [employer]. Dictation errors may occur despite best attempts at proofreading.  Final Clinical Impression(s) / ED Diagnoses Final diagnoses:  Dysmenorrhea    Rx / DC Orders ED Discharge Orders          Ordered    dicyclomine (BENTYL) 20 MG tablet  2 times daily        05/18/21 1516    promethazine (PHENERGAN) 25 MG tablet  Every 6 hours PRN        05/18/21 1516    famotidine (PEPCID) 20 MG tablet  2 times daily        05/18/21 1516             Delia Heady, PA-C 05/18/21 1517    Kommor, Debe Coder, MD 05/18/21 1627

## 2021-05-18 NOTE — ED Triage Notes (Signed)
Started menstrual yesterday with increased pain and nausea/vomiting.

## 2021-05-22 ENCOUNTER — Other Ambulatory Visit (HOSPITAL_COMMUNITY)
Admission: RE | Admit: 2021-05-22 | Discharge: 2021-05-22 | Disposition: A | Discharge status: Home / Self Care | Payer: 59 | Source: Ambulatory Visit | Attending: Internal Medicine | Admitting: Internal Medicine

## 2021-05-22 ENCOUNTER — Other Ambulatory Visit: Payer: Self-pay

## 2021-05-22 ENCOUNTER — Encounter: Payer: Self-pay | Admitting: Internal Medicine

## 2021-05-22 ENCOUNTER — Ambulatory Visit: Payer: 59 | Attending: Internal Medicine | Admitting: Internal Medicine

## 2021-05-22 VITALS — BP 114/78 | HR 63 | Resp 16 | Wt 195.6 lb

## 2021-05-22 DIAGNOSIS — Z124 Encounter for screening for malignant neoplasm of cervix: Secondary | ICD-10-CM | POA: Insufficient documentation

## 2021-05-22 DIAGNOSIS — N920 Excessive and frequent menstruation with regular cycle: Secondary | ICD-10-CM

## 2021-05-22 DIAGNOSIS — D259 Leiomyoma of uterus, unspecified: Secondary | ICD-10-CM

## 2021-05-22 DIAGNOSIS — Z2821 Immunization not carried out because of patient refusal: Secondary | ICD-10-CM

## 2021-05-22 NOTE — Progress Notes (Signed)
Patient ID: Brianna Sampson, female    DOB: 1980-10-27  MRN: [id number]  CC: Gynecologic Exam   Subjective: Brianna Sampson is a 40 y.o. female who presents for PAP. Her concerns today include:  Patient with history of tobacco dependence, obesity, depression/anxiety, dysmenorrhea.  GYN History:  Pt is G8P5 (1 miscarrage and 2 abortions) Any hx of abn paps?: no Menses regular or irregular?: regular How long does menses last? 5-6 days Menstrual flow light or heavy?: heavy bleeding with clots and a lot of cramps.  Still taking iron.  I referred her to the gynecologist in June after pelvic ultrasound confirmed uterine fibroids and other changes suggesting adenomyosis.  Patient states that she has not received an appointment or call as yet.  She was seen in the emergency room on the eighth of this month with abdominal cramping, nausea and vomiting associated with her menstrual cycle.  Patient states that she usually gets nausea with her cycles but never had vomiting.  She was discharged on Phenergan to use as needed.  CT scan of the abdomen revealed uterine fibroids and was otherwise negative. Method of birth control?: none Any vaginal dischg at this time?: no Dysuria?: no Any hx of STI?: in her early 1s Sexually active with how many partners: 1 Desires STI screen: yes Last MMG: reports having had MMG earlier this yr because she was having soreness RT breast.  MMG was okay per her report.   Family hx of uterine, cervical or breast cancer?: no   Patient Active Problem List   Diagnosis Date Noted   Bipolar affective disorder, depressed, moderate ([place]) 03/16/2021   Iron deficiency anemia 12/29/2020   Dysmenorrhea 12/26/2020   GAD (generalized anxiety disorder) 12/26/2020   Postpartum care and examination 07/23/2017   Contraception management 07/23/2017   History of C-section 05/06/2017   Low vitamin D level 12/13/2016   OBESITY 02/12/2008   Tobacco dependence 02/12/2008      Current Outpatient Medications on File Prior to Visit  Medication Sig Dispense Refill   dicyclomine (BENTYL) 20 MG tablet Take 1 tablet (20 mg total) by mouth 2 (two) times daily. 10 tablet 0   famotidine (PEPCID) 20 MG tablet Take 1 tablet (20 mg total) by mouth 2 (two) times daily. 30 tablet 0   ferrous sulfate 325 (65 FE) MG tablet Take 1 tablet (325 mg total) by mouth daily with breakfast. (Patient not taking: Reported on 05/18/2021) 100 tablet 1   gabapentin (NEURONTIN) 100 MG capsule Take 1 capsule (100 mg total) by mouth 3 (three) times daily. 90 capsule 2   hydrOXYzine (VISTARIL) 25 MG capsule TAKE 1 CAPSULE BY MOUTH TWICE DAILY AS NEEDED FOR ANXIETY MEDICATION  MAY  CAUSE  DROWSINESS (Patient taking differently: Take 25 mg by mouth 2 (two) times daily as needed for anxiety.) 30 capsule 0   ibuprofen (ADVIL) 200 MG tablet Take 600 mg by mouth every 6 (six) hours as needed for fever, headache or mild pain.     naproxen sodium (ALEVE) 220 MG tablet Take 440-660 mg by mouth 2 (two) times daily as needed (headache/pain/fever).     promethazine (PHENERGAN) 25 MG tablet Take 1 tablet (25 mg total) by mouth every 6 (six) hours as needed for nausea or vomiting. 10 tablet 0   QUEtiapine (SEROQUEL) 50 MG tablet Take 1 tablet (50 mg total) by mouth at bedtime. 30 tablet 1   sertraline (ZOLOFT) 50 MG tablet 1 tab PO daily (Patient taking differently: Take 50  mg by mouth daily.) 30 tablet 1   No current facility-administered medications on file prior to visit.    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 1.5 grams   Sexual activity: Yes    Partners: Male  Other Topics Concern   Not on file  Social History Narrative   Not on file   Social Determinants of Health   Financial Resource Strain: Medium Risk   Difficulty of Paying Living Expenses: Somewhat hard  Food Insecurity: No Food Insecurity   Worried About Running Out of Food in the Last Year: Never true   Ran Out of Food in the Last Year: Never true  Transportation Needs: No Transportation Needs   Lack of Transportation (Medical): No   Lack of Transportation (Non-Medical): No  Physical Activity: Sufficiently Active   Days of Exercise per Week: 7 days   Minutes of Exercise per Session: 30 min  Stress: Stress Concern Present   Feeling of Stress : Very much  Social Connections: Moderately Isolated   Frequency of Communication with Friends and Family: Twice a week   Frequency of Social Gatherings with Friends and Family: Never   Attends Religious Services: 1 to 4 times per year   Active Member of [employer] or Organizations: No   Attends Music therapist: Never   Marital Status: Living with partner  Human resources officer Violence: Not At Risk   Fear of Current or Ex-Partner: No   Emotionally Abused: No   Physically Abused: No   Sexually Abused: No    Family History  Problem Relation Age of Onset   Hypertension Mother    Diabetes Mother    Heart disease Mother     Past Surgical History:  Procedure Laterality Date   CESAREAN SECTION     CHOLECYSTECTOMY  2002    ROS: Review of Systems Negative except as stated above  PHYSICAL EXAM: BP 114/78   Pulse 63   Resp 16   Wt 195 lb 9.6 oz (88.7 kg)   LMP 05/17/2021 Comment: negative HCG blood test 05-18-2021  SpO2 100%   BMI 39.51  kg/m   Physical Exam  General appearance - alert, well appearing, and in no distress Mental status - normal mood, behavior, speech, dress, motor activity, and thought processes Pelvic -CMA Pollock present for exam.  Normal external genitalia, vulva, vagina, cervix.  Small amount of blood within the vaginal vault.  Uterus feels top normal size.  No adnexal masses appreciated.   CMP Latest Ref Rng & Units 05/18/2021 12/26/2020 10/22/2016  Glucose 70 - 99 mg/dL 142(H) 85 100(H)  BUN 6 - 20 mg/dL '19 12 10  '$ Creatinine 0.44 - 1.00 mg/dL 0.74 0.75 0.65  Sodium 135 -  145 mmol/L 139 137 139  Potassium 3.5 - 5.1 mmol/L 4.1 4.9 3.9  Chloride 98 - 111 mmol/L 109 106 106  CO2 22 - 32 mmol/L 23 18(L) 25  Calcium 8.9 - 10.3 mg/dL 9.4 9.2 9.3  Total Protein 6.5 - 8.1 g/dL 7.3 7.1 7.3  Total Bilirubin 0.3 - 1.2 mg/dL 0.5 <0.2 0.3  Alkaline Phos 38 - 126 U/L 78 87 66  AST 15 - 41 U/L 18 9 13(L)  ALT 0 - 44 U/L 12 6 10(L)   Lipid Panel     Component Value Date/Time   CHOL 167 12/26/2020 1042   TRIG 62 12/26/2020 1042   HDL 46 12/26/2020 1042   CHOLHDL 3.6 12/26/2020 1042   CHOLHDL 3.2 Ratio 01/11/2010 2243   VLDL 9 01/11/2010 2243   LDLCALC 109 (H) 12/26/2020 1042    CBC    Component Value Date/Time   WBC 12.8 (H) 05/18/2021 1225   RBC 4.27 05/18/2021 1225   HGB 13.5 05/18/2021 1225   HGB 10.7 (L) 12/26/2020 1042   HCT 40.7 05/18/2021 1225   HCT 34.1 12/26/2020 1042   PLT 307 05/18/2021 1225   PLT 349 12/26/2020 1042   MCV 95.3 05/18/2021 1225   MCV 86 12/26/2020 1042   MCH 31.6 05/18/2021 1225   MCHC 33.2 05/18/2021 1225   RDW 14.7 05/18/2021 1225   RDW 16.7 (H) 12/26/2020 1042   LYMPHSABS 0.9 05/18/2021 1225   LYMPHSABS 2.7 12/04/2016 1624   MONOABS 0.5 05/18/2021 1225   EOSABS 0.0 05/18/2021 1225   EOSABS 0.2 12/04/2016 1624   BASOSABS 0.0 05/18/2021 1225   BASOSABS 0.0 12/04/2016 1624    ASSESSMENT AND PLAN: 1. Pap smear for cervical cancer screening - Cervicovaginal  ancillary only - Cytology - PAP  2. Menorrhagia with regular cycle 3. Uterine leiomyoma, unspecified location I will have our referral coordinator check on the gynecology referral that was submitted in June.  Advised use of Naprosyn or ibuprofen as needed for cramps.  4. Influenza vaccination declined Initially patient agreed to have the flu vaccine.  However subsequently my CMA told me that she declined.     Patient was given the opportunity to ask questions.  Patient verbalized understanding of the plan and was able to repeat key elements of the plan.   No orders of the defined types were placed in this encounter.    Requested Prescriptions    No prescriptions requested or ordered in this encounter    Return in about 6 months (around 11/19/2021).  Karle Plumber, MD, FACP

## 2021-05-23 ENCOUNTER — Other Ambulatory Visit: Payer: Self-pay | Admitting: Internal Medicine

## 2021-05-23 LAB — CERVICOVAGINAL ANCILLARY ONLY
Bacterial Vaginitis (gardnerella): POSITIVE — AB
Candida Glabrata: NEGATIVE
Candida Vaginitis: NEGATIVE
Chlamydia: NEGATIVE
Comment: NEGATIVE
Comment: NEGATIVE
Comment: NEGATIVE
Comment: NEGATIVE
Comment: NEGATIVE
Comment: NORMAL
Neisseria Gonorrhea: NEGATIVE
Trichomonas: NEGATIVE

## 2021-05-23 MED ORDER — METRONIDAZOLE 500 MG PO TABS: 500.0000 mg | ORAL_TABLET | Freq: Two times a day (BID) | ORAL | 0 refills | Status: DC

## 2021-05-25 ENCOUNTER — Telehealth: Payer: Self-pay

## 2021-05-25 LAB — CYTOLOGY - PAP
Comment: NEGATIVE
Diagnosis: NEGATIVE
High risk HPV: NEGATIVE

## 2021-05-25 NOTE — Telephone Encounter (Signed)
Contacted pt to go over lab results pt didn't answer lvm asking pt to give me a call at her/his earliest convenience   Sent a CRM and forward labs to [facility] to give pt labs when they call back

## 2021-06-01 ENCOUNTER — Other Ambulatory Visit: Payer: Self-pay | Admitting: Internal Medicine

## 2021-06-01 DIAGNOSIS — F411 Generalized anxiety disorder: Secondary | ICD-10-CM

## 2021-06-03 ENCOUNTER — Other Ambulatory Visit (HOSPITAL_COMMUNITY): Payer: Self-pay | Admitting: Psychiatry

## 2021-06-03 DIAGNOSIS — F411 Generalized anxiety disorder: Secondary | ICD-10-CM

## 2021-06-03 DIAGNOSIS — F330 Major depressive disorder, recurrent, mild: Secondary | ICD-10-CM

## 2021-06-05 ENCOUNTER — Encounter (HOSPITAL_COMMUNITY): Payer: Self-pay

## 2021-06-05 ENCOUNTER — Telehealth (INDEPENDENT_AMBULATORY_CARE_PROVIDER_SITE_OTHER): Payer: 59 | Admitting: Psychiatry

## 2021-06-05 ENCOUNTER — Other Ambulatory Visit: Payer: Self-pay

## 2021-06-05 DIAGNOSIS — F330 Major depressive disorder, recurrent, mild: Secondary | ICD-10-CM | POA: Diagnosis not present

## 2021-06-05 DIAGNOSIS — F3132 Bipolar disorder, current episode depressed, moderate: Secondary | ICD-10-CM | POA: Diagnosis not present

## 2021-06-05 DIAGNOSIS — F411 Generalized anxiety disorder: Secondary | ICD-10-CM | POA: Diagnosis not present

## 2021-06-05 MED ORDER — SERTRALINE HCL 50 MG PO TABS: 50.0000 mg | ORAL_TABLET | Freq: Every day | ORAL | 3 refills | Status: DC

## 2021-06-05 MED ORDER — OXCARBAZEPINE 300 MG PO TABS: 150.0000 mg | ORAL_TABLET | Freq: Two times a day (BID) | ORAL | 3 refills | Status: DC

## 2021-06-05 MED ORDER — QUETIAPINE FUMARATE 50 MG PO TABS: 50.0000 mg | ORAL_TABLET | Freq: Every day | ORAL | 3 refills | Status: DC

## 2021-06-05 MED ORDER — GABAPENTIN 100 MG PO CAPS: 100.0000 mg | ORAL_CAPSULE | Freq: Three times a day (TID) | ORAL | 3 refills | Status: DC

## 2021-06-05 NOTE — Progress Notes (Signed)
[facility] NP OP Progress Notes   Patient Identification: Brianna Sampson MRN:  [id number] Date of Evaluation:  06/05/2021 Referral Source: Self Chief Complaint:   Chief Complaint   Anxiety; Follow-up; Advice Only; Depression    Visit Diagnosis:    ICD-10-CM   1. Bipolar affective disorder, depressed, moderate (HCC)  F31.32       History of Present Illness:   40 yo female with depression and anxiety, history of bipolar disorder.   Moderate depression, no suicidal ideations.  Irritable at times, easily frustrated.  Discussed medications and decided on Trileptal to assist her mood.  She reports anxiety is 5/10, depression is 5/10; medication is helping her symptoms. She reports that overall she feels she is doing better with her depression and anxiety with occasional periods of "spurts of being mean", does not have a great support system "sucky", has a lot going on. Her daughters are the ones who are causing issues, her sons are great supports.  She denies any side effects of her medication. Appetite is "terrible", she denies loss of appetite, she has long periods of not eating, missed meals and reports eating at midnight. She denies any suicidal ideation, feels safe in her environment.   Past Psychiatric History: bipolar disorder, depression, and anxiety  Previous Psychotropic Medications: Yes   Past Medical History:  Past Medical History:  Diagnosis Date   Anxiety    ARTHRITIS, RIGHT KNEE 08/16/2008   Qualifier: Diagnosis of  By: Moshe Cipro MD, [place]     Depression     Past Surgical History:  Procedure Laterality Date   CESAREAN SECTION     CHOLECYSTECTOMY  2002    Family Psychiatric History: mother with bipolar d/o  Family History:  Family History  Problem Relation Age of Onset   Hypertension Mother    Diabetes Mother    Heart disease Mother     Social History:   Social History   Socioeconomic History   Marital status: Significant Other    Spouse name: Not on file    Number of children: 5   Years of education: Not on file   Highest education level: Some college, no degree  Occupational History   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 1.5 grams   Sexual activity: Yes    Partners: Male  Other Topics Concern   Not on file  Social History Narrative   Not on file   Social Determinants of Health   Financial Resource Strain: Medium Risk   Difficulty of Paying Living Expenses: Somewhat hard  Food Insecurity: No Food Insecurity   Worried About Running Out of Food in the Last Year: Never true   Ran Out of Food in the Last Year: Never true  Transportation Needs: No Transportation Needs   Lack of Transportation (Medical): No   Lack of Transportation (Non-Medical): No  Physical Activity: Sufficiently Active   Days of Exercise per Week: 7 days   Minutes of Exercise per Session: 30 min  Stress: Stress Concern Present   Feeling  of Stress : Very much  Social Connections: Moderately Isolated   Frequency of Communication with Friends and Family: Twice a week   Frequency of Social Gatherings with Friends and Family: Never   Attends Religious Services: 1 to 4 times per year   Active Member of Clubs or Organizations: No   Attends Archivist Meetings: Never   Marital Status: Living with partner    Additional Social History: lives with her partner and five children  Allergies:  No Known Allergies  Metabolic Disorder Labs: Lab Results  Component Value Date   HGBA1C 5.5 12/26/2020   No results found for: PROLACTIN Lab Results  Component Value Date   CHOL 167 12/26/2020    TRIG 62 12/26/2020   HDL 46 12/26/2020   CHOLHDL 3.6 12/26/2020   VLDL 9 01/11/2010   LDLCALC 109 (H) 12/26/2020   [place] 90 01/11/2010   Lab Results  Component Value Date   TSH <0.006 (L) 12/04/2016    Therapeutic Level Labs: No results found for: LITHIUM No results found for: CBMZ No results found for: VALPROATE  Current Medications: Current Outpatient Medications  Medication Sig Dispense Refill   dicyclomine (BENTYL) 20 MG tablet Take 1 tablet (20 mg total) by mouth 2 (two) times daily. 10 tablet 0   famotidine (PEPCID) 20 MG tablet Take 1 tablet (20 mg total) by mouth 2 (two) times daily. 30 tablet 0   ferrous sulfate 325 (65 FE) MG tablet Take 1 tablet (325 mg total) by mouth daily with breakfast. (Patient not taking: Reported on 05/18/2021) 100 tablet 1   gabapentin (NEURONTIN) 100 MG capsule Take 1 capsule (100 mg total) by mouth 3 (three) times daily. 90 capsule 2   hydrOXYzine (VISTARIL) 25 MG capsule TAKE 1 CAPSULE BY MOUTH TWICE DAILY AS NEEDED FOR ANXIETY MAY CAUSE DROWSINESS 30 capsule 0   ibuprofen (ADVIL) 200 MG tablet Take 600 mg by mouth every 6 (six) hours as needed for fever, headache or mild pain.     metroNIDAZOLE (FLAGYL) 500 MG tablet Take 1 tablet (500 mg total) by mouth 2 (two) times daily. 14 tablet 0   naproxen sodium (ALEVE) 220 MG tablet Take 440-660 mg by mouth 2 (two) times daily as needed (headache/pain/fever).     promethazine (PHENERGAN) 25 MG tablet Take 1 tablet (25 mg total) by mouth every 6 (six) hours as needed for nausea or vomiting. 10 tablet 0   QUEtiapine (SEROQUEL) 50 MG tablet Take 1 tablet (50 mg total) by mouth at bedtime. 30 tablet 1   sertraline (ZOLOFT) 50 MG tablet 1 tab PO daily (Patient taking differently: Take 50 mg by mouth daily.) 30 tablet 1   No current facility-administered medications for this visit.    Musculoskeletal: Strength & Muscle Tone: within normal limits Gait & Station: normal Patient leans: N/A  Psychiatric  Specialty Exam: Review of Systems  Psychiatric/Behavioral:  Positive for dysphoric mood. The patient is nervous/anxious.   All other systems reviewed and are negative.  Last menstrual period 05/18/2021, unknown if currently breastfeeding.There is no height or weight on file to calculate BMI.  General Appearance: Casual  Eye Contact:  Good  Speech:  Normal Rate  Volume:  Normal  Mood:  Anxious and Depressed  Affect:  Congruent  Thought Process:  Coherent and Descriptions of Associations: Intact  Orientation:  Full (Time, Place, and Person)  Thought Content:  WDL and Logical  Suicidal Thoughts:  No  Homicidal Thoughts:  No  Memory:  Immediate;  Good Recent;   Good Remote;   Good  Judgement:  Good  Insight:  Good  Psychomotor Activity:  Normal  Concentration:  Concentration: Good and Attention Span: Good  Recall:  Good  Fund of Knowledge:Good  Language: Good  Akathisia:  No  Handed:  Right  AIMS (if indicated):  not done  Assets:  Communication Skills Leisure Time Physical Health Resilience Social Support  ADL's:  Intact  Cognition: WNL  Sleep:  Poor   Screenings: GAD-7    Flowsheet Row Office Visit from 05/22/2021 in [facility] from 05/11/2021 in [facility] Office Visit from 12/26/2020 in [facility]  Total GAD-7 Score 20 18 19       PHQ2-9    [place] Office Visit from 05/22/2021 in [facility] from 05/11/2021 in [facility] Office Visit from 03/20/2021 in [facility] Counselor from 03/16/2021 in [facility] Office Visit from 12/26/2020 in [facility]  PHQ-2 Total Score 4 3 6 6 6   PHQ-9 Total Score 18 14 22 20 23       Flowsheet Row ED from 05/18/2021 in [place] [facility] Counselor from  05/11/2021 in [facility] Office Visit from 03/20/2021 in [facility] No Risk Low Risk Error: Q3, 4, or 5 should not be populated when Q2 is No       Assessment and Plan:  Bipolar affective disorder, depression, moderate: -Continue Seroquel 50 mg daily at bedtime -Continue Zoloft 50 mg daily -Started Trileptal 150 mg BID -Follow up in 2 months -Continue therapy  Anxiety: -Continue gabapentin 100 mg TID PRN, take this or the hydroxyzine for anxiety  Insomnia: -Discontinued Trazodone 25 mg at bedtime PRN -Started Seroquel 50 mg at bedtime -Practice sleep hygiene, specifically going to be later  Virtual Visit via Video Note  I connected with Argentina Donovan on 06/05/21 at  9:30 AM EDT by a video enabled telemedicine application and verified that I am speaking with the correct person using two identifiers.  Location: Patient: home Provider: home office   I discussed the limitations of evaluation and management by telemedicine and the availability of in person appointments. The patient expressed understanding and agreed to proceed.  Follow Up Instructions: Follow up in 2 weeks   I discussed the assessment and treatment plan with the patient. The patient was provided an opportunity to ask questions and all were answered. The patient agreed with the plan and demonstrated an understanding of the instructions.   The patient was advised to call back or seek an in-person evaluation if the symptoms worsen or if the condition fails to improve as anticipated.  I provided 20 minutes of non-face-to-face time during this encounter.   Waylan Boga, NP   Waylan Boga, NP 9/26/20229:42 AM

## 2021-06-12 ENCOUNTER — Ambulatory Visit (INDEPENDENT_AMBULATORY_CARE_PROVIDER_SITE_OTHER): Payer: 59 | Admitting: Licensed Clinical Social Worker

## 2021-06-12 ENCOUNTER — Other Ambulatory Visit: Payer: Self-pay

## 2021-06-12 DIAGNOSIS — F3132 Bipolar disorder, current episode depressed, moderate: Secondary | ICD-10-CM | POA: Diagnosis not present

## 2021-06-12 NOTE — Progress Notes (Signed)
   THERAPIST PROGRESS NOTE  Session Time: 63  Virtual Visit via Video Note  I connected with Brianna Sampson on 06/12/21 at 10:00 AM EDT by a video enabled telemedicine application and verified that I am speaking with the correct person using two identifiers.  Location: Patient: Brianna Sampson  Provider: [facility]   I discussed the limitations of evaluation and management by telemedicine and the availability of in person appointments. The patient expressed understanding and agreed to proceed.      I discussed the assessment and treatment plan with the patient. The patient was provided an opportunity to ask questions and all were answered. The patient agreed with the plan and demonstrated an understanding of the instructions.   The patient was advised to call back or seek an in-person evaluation if the symptoms worsen or if the condition fails to improve as anticipated.  I provided 40 minutes of non-face-to-face time during this encounter.   Dory Horn, LCSW   Participation Level: Active  Behavioral Response: CasualAlertDepressed  Type of Therapy: Individual Therapy  Treatment Goals addressed: Diagnosis: bipolar depression  Interventions: Motivational Interviewing and Supportive  Summary: Brianna Sampson is a 40 y.o. female who presents with depressed and anxious mood\affect.  Patient was pleasant, cooperative, and maintained good eye contact.  Brianna Sampson engaged well in therapy session.  Patient primary stressor is his work.  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  Patient reports between all 3 jobs she works about 80 to 100 hours/week.  Brianna Sampson reports a lack of motivation for her activities of daily living example utilized was showering and washing her hair on a regular basis.  Patient states that she does so much for other people that when she gets home she just wants to lay  around.  LCSW and patient spoke about today setting boundaries at work.  Some examples were cutting back on hours and/or requesting a 1 week vacation for self care.  Suicidal/Homicidal: NAwithout intent/plan  Therapist Response:    Intervention/Plan: Interventions utilized in today's session was person centered therapy, supportive therapy, and motivational interviewing.  LCSW utilized language for encouragement, empowerment, and praise.  Patient and LCSW spoke about setting boundaries in today's session and about utilizing self-care on a weekly basis.  LCSW utilized genuineness and free association in today's session.  Plan for patient moving forward is to make a list of boundaries that she would like to set in her home work and personal life.  Plan: Return again in 4  weeks.     Dory Horn, LCSW 06/12/2021

## 2021-06-15 ENCOUNTER — Ambulatory Visit (HOSPITAL_COMMUNITY): Payer: 59 | Admitting: Licensed Clinical Social Worker

## 2021-06-21 ENCOUNTER — Ambulatory Visit: Payer: Self-pay

## 2021-06-21 NOTE — Telephone Encounter (Signed)
Summary: cramping with menstrual cycle/antibiotic question   Priority: Routine Created on: 06/15/2021 10:22 AM By: Greggory Keen D   Source Subject Topic  Brianna Sampson (Patient) Brianna Sampson (Patient) General - Inquiry  Reason for CRM: Pt called saying 800 mg of ibuprofen for her menstrual cycle and she said they are making her feel sick on her stomach and still having cramps.  She also states she has a yeast infection from the antibiotic.   [facility] on [address]   CB#  [phone]   Left message to call back.

## 2021-06-21 NOTE — Telephone Encounter (Signed)
Patient called and says she's having vaginal itching from an antibiotic that Dr. Wynetta Emery prescribed. She says her menstrual cycle just went off, so she's not cramping anymore. She says she has vaginal itching on the inside, a little creamy discharge when wiping, but not a lot of discharge. She says the itching is moderate. She denies any other symptoms. Advised no available appointments with PCP or any provider in the office. Advised MyChart E-Visit, explained what the e-visit involves, care advice given, patient verbalized understanding.    Summary: cramping with menstrual cycle/antibiotic question   Priority: Routine Created on: 06/15/2021 10:22 AM By: Greggory Keen D   Source Subject Topic  Brianna Sampson (Patient) Brianna Sampson (Patient) General - Inquiry  Reason for CRM: Pt called saying 800 mg of ibuprofen for her menstrual cycle and she said they are making her feel sick on her stomach and still having cramps.  She also states she has a yeast infection from the antibiotic.   [facility] on [address]   CB#  [phone]      Reason for Disposition  [1] Symptoms of a yeast infection (i.e., itchy, white discharge, not bad smelling) AND [2] not improved > 3 days following CARE ADVICE  Answer Assessment - Initial Assessment Questions 1. SYMPTOM: "What's the main symptom you're concerned about?" (e.g., pain, itching, dryness)     Itching 2. LOCATION: "Where is the itching located?" (e.g., inside/outside, left/right)     Inside 3. ONSET: "When did the itching start?"     2 days after starting the antibiotics prescribed by Dr. Wynetta Emery 4. PAIN: "Is there any pain?" If Yes, ask: "How bad is it?" (Scale: 1-10; mild, moderate, severe)     No 5. ITCHING: "Is there any itching?" If Yes, ask: "How bad is it?" (Scale: 1-10; mild, moderate, severe)     Moderate 6. CAUSE: "What do you think is causing the discharge?" "Have you had the same problem before? What happened then?"      Not that much discharge, a little creamy when wiping; have had yeast infections before 7. OTHER SYMPTOMS: "Do you have any other symptoms?" (e.g., fever, itching, vaginal bleeding, pain with urination, injury to genital area, vaginal foreign body)     No other symptoms 8. PREGNANCY: "Is there any chance you are pregnant?" "When was your last menstrual period?"     No, just completed menstrual period  Protocols used: Vaginal Symptoms-A-AH

## 2021-06-26 NOTE — Progress Notes (Signed)
This encounter was created in error - please disregard.

## 2021-07-14 ENCOUNTER — Ambulatory Visit (INDEPENDENT_AMBULATORY_CARE_PROVIDER_SITE_OTHER): Payer: 59 | Admitting: Licensed Clinical Social Worker

## 2021-07-14 DIAGNOSIS — F411 Generalized anxiety disorder: Secondary | ICD-10-CM

## 2021-07-14 DIAGNOSIS — F3132 Bipolar disorder, current episode depressed, moderate: Secondary | ICD-10-CM

## 2021-07-14 NOTE — Progress Notes (Signed)
THERAPIST PROGRESS NOTE  Session Time: 62  Virtual Visit via Video Note  I connected with Brianna Sampson on 07/14/21 at 10:00 AM EDT by a video enabled telemedicine application and verified that I am speaking with the correct person using two identifiers.  Location: Patient: [facility]  Provider: [facility]    I discussed the limitations of evaluation and management by telemedicine and the availability of in person appointments. The patient expressed understanding and agreed to proceed.   I discussed the assessment and treatment plan with the patient. The patient was provided an opportunity to ask questions and all were answered. The patient agreed with the plan and demonstrated an understanding of the instructions.   The patient was advised to call back or seek an in-person evaluation if the symptoms worsen or if the condition fails to improve as anticipated.  I provided 40 minutes of non-face-to-face time during this encounter.   Dory Horn, LCSW   Participation Level: Active  Behavioral Response: CasualAlertAnxious and Depressed  Type of Therapy: Individual Therapy  Treatment Goals addressed: Anxiety and Diagnosis: bipolar depression  Interventions: CBT, Solution Focused, and Supportive  Summary: Brianna Sampson is a 40 y.o. female who presents with irritable and anxious mood\affect.  Patient was dressed casually and alert and oriented x5.  She engaged well in therapy session.  Patient was pleasant, cooperative, and maintained good eye contact in session.   Primary stressor for patient is irritability and "not caring".  Patient reports that her medications overall have been working well however her sleeping medication has made her "too drowsy".  LCSW recommended to patient to call the nursing line to see if her sleeping medication could be cut in half.  Patient was agreeable to this plan.  Patient reports that she goes to bed around 11 or midnight  every single night because she has to pick up her son from work.  Patient usually takes her medications around midnight and has to be up at 6 AM in the morning.  LCSW advised patient that in order for medication to work on full dosage usually a provider will calculate it for an 8-hour period of time.  LCSW did advise patient to speak with nursing staff about cutting down the dosage if needed.  Stressors for patient are family conflict.  She reports that she was lacking communication with her significant other.  She reports that he was "in sensitive".  He would say things like "I would not do that" when she would talk about things that she did throughout her day.  LCSW advised patient last time to have an open line of communication and make sure that she was expressing her thoughts, feelings, and concerns to her significant other so that they did not become internalized.  Other stressors for family conflict include having to take care of her grandchild.  Patient reports that her daughter is working on a regular basis a 40-hour week job.  She reports that she is now having to stay home in the mornings to take care of the child.  Brianna Sampson reports that she is attempting to find daycare for the child but has not been successful as of yet.  Patient endorses some suicidal thoughts without an plan or intent.  She reports that she does not want to kill herself at this time however when she becomes very irritable she just does not want to be here.  LCSW did complete a safety plan with patient and that is  charted. Suicidal/Homicidal: NAwithout intent/plan  Therapist Response:    Intervention/Plan: LCSW administered a GAD-7 and a PHQ-9 with patient here today.  Patient scored on GAD-7 and 8 and on her PHQ-9 she scored an 11.  That is a decrease for her GAD-7 of 10 points and 60 days.  That is also a decrease of 3 points for her PHQ-9 in the last 60 days.  Plan for patient is to follow-up with nursing To see if cutting  down her medications may be possible for her sleeping meds.  Patient to also continue to have a open line of communication and have monthly day night sweats or significant other.  Patient to continue to advocate for proper daycare for her grandchild.  Brianna Sampson was agreeable to plan.  Plan: Return again in 4 weeks.      Dory Horn, LCSW 07/14/2021

## 2021-07-31 ENCOUNTER — Telehealth (HOSPITAL_COMMUNITY): Payer: 59

## 2021-08-17 ENCOUNTER — Ambulatory Visit (INDEPENDENT_AMBULATORY_CARE_PROVIDER_SITE_OTHER): Payer: 59 | Admitting: Licensed Clinical Social Worker

## 2021-08-17 DIAGNOSIS — F3132 Bipolar disorder, current episode depressed, moderate: Secondary | ICD-10-CM

## 2021-08-17 DIAGNOSIS — F411 Generalized anxiety disorder: Secondary | ICD-10-CM

## 2021-08-17 NOTE — Progress Notes (Signed)
   THERAPIST PROGRESS NOTE  Virtual Visit via Video Note  I connected with Argentina Donovan on 08/17/21 at 10:00 AM EST by a video enabled telemedicine application and verified that I am speaking with the correct person using two identifiers.  Location: Patient: Brianna Sampson  Provider: Providers Home    I discussed the limitations of evaluation and management by telemedicine and the availability of in person appointments. The patient expressed understanding and agreed to proceed.  Pt was alert and oriented x 5. She was dressed in a night cap and pajamas. Maxcine was pleasant, cooperative, and maintained good eye contact. She engaged well in therapy session.   Primary stressors are holidays season and family conflict. Pt reports an increase in her depression for irritability, poor concentration, and hopelessness. Mattisen reports that Thanksgiving did not go well for her. She was frustrated with her families lack participation. Pt reports that she ended up doing much of the prep and cleanup work. Pt was frustrated that things were not going according to her plan. Larraine states this could be related to the holiday season as she states her anxiety and depression goes up around family interactions.   Intervention: Supportive therapy, CBT, and person-centered therapy utilized in today's session. LCSW administer a PHQ-9 which increased 6 points in last 4 weeks GAD-7 increase 1 point in last three week. LCSW utilized language for praise, encouragement, and empowerment. LCSW educated patient on signs and symptoms of bipolar depression for irritability door concertation. Psychoanalytic therapy was used for free association for expression thoughts, feelings, and emotions.   Plan: f/u in 4 weeks with LCSW and f/u on 12/12 for medication management. Pt utilize boundary setting at least 1 x weekly.   I discussed the assessment and treatment plan with the patient. The patient was provided an opportunity to ask  questions and all were answered. The patient agreed with the plan and demonstrated an understanding of the instructions.   The patient was advised to call back or seek an in-person evaluation if the symptoms worsen or if the condition fails to improve as anticipated.  I provided 40 minutes of non-face-to-face time during this encounter.   Dory Horn, LCSW   Participation Level: Active  Behavioral Response: CasualAlertDepressed  Type of Therapy: Individual Therapy  Treatment Goals addressed: Anxiety and Diagnosis: Depression   Interventions: CBT and Motivational Interviewing   Suicidal/Homicidal: Nowithout intent/plan   Plan: Return again in 4 weeks.     Dory Horn, LCSW 08/17/2021

## 2021-08-21 ENCOUNTER — Telehealth (INDEPENDENT_AMBULATORY_CARE_PROVIDER_SITE_OTHER): Payer: 59 | Admitting: Psychiatry

## 2021-08-21 ENCOUNTER — Other Ambulatory Visit: Payer: Self-pay

## 2021-08-21 ENCOUNTER — Ambulatory Visit (INDEPENDENT_AMBULATORY_CARE_PROVIDER_SITE_OTHER): Payer: 59 | Admitting: Obstetrics & Gynecology

## 2021-08-21 ENCOUNTER — Encounter (HOSPITAL_BASED_OUTPATIENT_CLINIC_OR_DEPARTMENT_OTHER): Payer: Self-pay | Admitting: Obstetrics & Gynecology

## 2021-08-21 ENCOUNTER — Encounter (HOSPITAL_COMMUNITY): Payer: Self-pay | Admitting: Psychiatry

## 2021-08-21 VITALS — BP 120/66 | HR 68 | Ht 60.0 in | Wt 193.2 lb

## 2021-08-21 DIAGNOSIS — D251 Intramural leiomyoma of uterus: Secondary | ICD-10-CM | POA: Diagnosis not present

## 2021-08-21 DIAGNOSIS — F411 Generalized anxiety disorder: Secondary | ICD-10-CM

## 2021-08-21 DIAGNOSIS — F17200 Nicotine dependence, unspecified, uncomplicated: Secondary | ICD-10-CM

## 2021-08-21 DIAGNOSIS — Z98891 History of uterine scar from previous surgery: Secondary | ICD-10-CM

## 2021-08-21 DIAGNOSIS — N946 Dysmenorrhea, unspecified: Secondary | ICD-10-CM

## 2021-08-21 DIAGNOSIS — N8003 Adenomyosis of the uterus: Secondary | ICD-10-CM | POA: Diagnosis not present

## 2021-08-21 DIAGNOSIS — F330 Major depressive disorder, recurrent, mild: Secondary | ICD-10-CM | POA: Diagnosis not present

## 2021-08-21 DIAGNOSIS — D500 Iron deficiency anemia secondary to blood loss (chronic): Secondary | ICD-10-CM | POA: Diagnosis not present

## 2021-08-21 DIAGNOSIS — F3132 Bipolar disorder, current episode depressed, moderate: Secondary | ICD-10-CM

## 2021-08-21 MED ORDER — TRAZODONE HCL 50 MG PO TABS: 50.0000 mg | ORAL_TABLET | Freq: Every day | ORAL | 2 refills | Status: DC

## 2021-08-21 MED ORDER — OXCARBAZEPINE 300 MG PO TABS: 150.0000 mg | ORAL_TABLET | Freq: Two times a day (BID) | ORAL | 3 refills | Status: AC

## 2021-08-21 MED ORDER — SERTRALINE HCL 100 MG PO TABS: 100.0000 mg | ORAL_TABLET | Freq: Every day | ORAL | 2 refills | Status: DC

## 2021-08-21 MED ORDER — GABAPENTIN 100 MG PO CAPS: 100.0000 mg | ORAL_CAPSULE | Freq: Three times a day (TID) | ORAL | 3 refills | Status: DC

## 2021-08-21 NOTE — Progress Notes (Signed)
[facility] NP OP Progress Notes   Patient Identification: Brianna Sampson MRN:  [id number] Date of Evaluation:  08/21/2021 Referral Source: Self Chief Complaint:   Chief Complaint   Anxiety; Follow-up; Depression    Visit Diagnosis:    ICD-10-CM   1. Bipolar affective disorder, depressed, moderate ([place])  F31.32     2. GAD (generalized anxiety disorder)  F41.1     3. Major depressive disorder, recurrent episode, mild (HCC)  F33.0       History of Present Illness:   40 yo female with depression and anxiety, history of bipolar disorder. Her depression has been worse since Thanksgiving as her family decided to come to her house and imposed causing her to have an increase in stress.  Since this time, she has been more irritable and depressed, low motivation.  The Seroquel is making her too sleepy, even a half dose.  Changed to Trazodone to assist and increased her Zoloft. Discussed coping strategies to assist in the meantime. She denies any suicidal ideation, feels safe in her environment.   Past Psychiatric History: bipolar disorder, depression, and anxiety  Previous Psychotropic Medications: Yes   Past Medical History:  Past Medical History:  Diagnosis Date   Anxiety    ARTHRITIS, RIGHT KNEE 08/16/2008   Qualifier: Diagnosis of  By: Moshe Cipro MD, [place]     Depression     Past Surgical History:  Procedure Laterality Date   CESAREAN SECTION     CHOLECYSTECTOMY  2002    Family Psychiatric History: mother with bipolar d/o  Family History:  Family History  Problem Relation Age of Onset   Hypertension Mother    Diabetes Mother    Heart disease Mother     Social History:   Social History   Socioeconomic History   Marital status: Significant Other    Spouse name: Not on file   Number of children: 5   Years of education: Not on file   Highest education level: Some college, no degree  Occupational History   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 1.5 grams   Sexual activity: Yes    Partners: Male  Other Topics Concern   Not on file  Social History Narrative   Not on file   Social Determinants of Health   Financial Resource Strain: Medium Risk   Difficulty of Paying Living Expenses: Somewhat hard  Food Insecurity: No Food Insecurity   Worried About Running Out of Food in the Last Year: Never true   Ran Out of Food in the Last Year: Never true  Transportation Needs: No Transportation Needs   Lack of Transportation (Medical): No   Lack of Transportation (Non-Medical): No  Physical Activity: Sufficiently Active   Days of Exercise per Week: 7 days   Minutes of Exercise per Session: 30 min  Stress: Stress Concern Present   Feeling of Stress : Very much  Social Connections: Moderately Isolated   Frequency of Communication with Friends and Family: Twice a week   Frequency of Social  Gatherings with Friends and Family: Never   Attends Religious Services: 1 to 4 times per year   Active Member of Clubs or Organizations: No   Attends Archivist Meetings: Never   Marital Status: Living with partner    Additional Social History: lives with her partner and five children  Allergies:  No Known Allergies  Metabolic Disorder Labs: Lab Results  Component Value Date   HGBA1C 5.5 12/26/2020   No results found for: PROLACTIN Lab Results  Component Value Date   CHOL 167 12/26/2020   TRIG 62 12/26/2020   HDL 46 12/26/2020   CHOLHDL 3.6 12/26/2020   VLDL 9 01/11/2010   LDLCALC 109 (H) 12/26/2020   [place] 90 01/11/2010   Lab Results  Component Value Date   TSH <0.006 (L)  12/04/2016    Therapeutic Level Labs: No results found for: LITHIUM No results found for: CBMZ No results found for: VALPROATE  Current Medications: Current Outpatient Medications  Medication Sig Dispense Refill   dicyclomine (BENTYL) 20 MG tablet Take 1 tablet (20 mg total) by mouth 2 (two) times daily. 10 tablet 0   famotidine (PEPCID) 20 MG tablet Take 1 tablet (20 mg total) by mouth 2 (two) times daily. 30 tablet 0   ferrous sulfate 325 (65 FE) MG tablet Take 1 tablet (325 mg total) by mouth daily with breakfast. (Patient not taking: Reported on 05/18/2021) 100 tablet 1   gabapentin (NEURONTIN) 100 MG capsule Take 1 capsule (100 mg total) by mouth 3 (three) times daily. 90 capsule 3   hydrOXYzine (VISTARIL) 25 MG capsule TAKE 1 CAPSULE BY MOUTH TWICE DAILY AS NEEDED FOR ANXIETY MAY CAUSE DROWSINESS 30 capsule 0   ibuprofen (ADVIL) 200 MG tablet Take 600 mg by mouth every 6 (six) hours as needed for fever, headache or mild pain.     metroNIDAZOLE (FLAGYL) 500 MG tablet Take 1 tablet (500 mg total) by mouth 2 (two) times daily. 14 tablet 0   naproxen sodium (ALEVE) 220 MG tablet Take 440-660 mg by mouth 2 (two) times daily as needed (headache/pain/fever).     Oxcarbazepine (TRILEPTAL) 300 MG tablet Take 0.5 tablets (150 mg total) by mouth 2 (two) times daily. 60 tablet 3   promethazine (PHENERGAN) 25 MG tablet Take 1 tablet (25 mg total) by mouth every 6 (six) hours as needed for nausea or vomiting. 10 tablet 0   QUEtiapine (SEROQUEL) 50 MG tablet Take 1 tablet (50 mg total) by mouth at bedtime. 30 tablet 3   sertraline (ZOLOFT) 50 MG tablet Take 1 tablet (50 mg total) by mouth daily. 30 tablet 3   No current facility-administered medications for this visit.    Musculoskeletal: Strength & Muscle Tone: within normal limits Gait & Station: normal Patient leans: N/A  Psychiatric Specialty Exam: Review of Systems  Psychiatric/Behavioral:  Positive for dysphoric mood. The patient is  nervous/anxious.   All other systems reviewed and are negative.  unknown if currently breastfeeding.There is no height or weight on file to calculate BMI.  General Appearance: Casual  Eye Contact:  Good  Speech:  Normal Rate  Volume:  Normal  Mood:  Anxious and Depressed  Affect:  Congruent  Thought Process:  Coherent and Descriptions of Associations: Intact  Orientation:  Full (Time, Place, and Person)  Thought Content:  WDL and Logical  Suicidal Thoughts:  No  Homicidal Thoughts:  No  Memory:  Immediate;   Good Recent;   Good Remote;   Good  Judgement:  Good  Insight:  Good  Psychomotor Activity:  Normal  Concentration:  Concentration: Good and Attention Span: Good  Recall:  Good  Fund of Knowledge:Good  Language: Good  Akathisia:  No  Handed:  Right  AIMS (if indicated):  not done  Assets:  Communication Skills Leisure Time Physical Health Resilience Social Support  ADL's:  Intact  Cognition: WNL  Sleep:  Poor   Screenings: GAD-7    Health and safety inspector from 08/17/2021 in [facility] Counselor from 07/14/2021 in [facility] Office Visit from 05/22/2021 in [facility] Counselor from 05/11/2021 in [facility] Office Visit from 12/26/2020 in [facility]  Total GAD-7 Score 9 8 20 18 19       PHQ2-9    Flowsheet Row Counselor from 08/17/2021 in [facility] Counselor from 07/14/2021 in [facility] Office Visit from 05/22/2021 in [facility] Counselor from 05/11/2021 in [facility] Office Visit from 03/20/2021 in [facility]  PHQ-2 Total Score 6 2 4 3 6   PHQ-9 Total Score 17 11 18 14 22       Flowsheet Row Counselor from 08/17/2021 in [facility] Counselor from  07/14/2021 in [facility] ED from 05/18/2021 in [facility] DEPT  C-SSRS RISK CATEGORY No Risk Low Risk No Risk       Assessment and Plan:  Bipolar affective disorder, depression, moderate: -Increase Zoloft 50 mg daily to 100 mg daily -Continue Trileptal 150 mg BID -Continue therapy  Anxiety: -Continue gabapentin 100 mg TID PRN  Insomnia: -Started Trazodone 25 mg at bedtime PRN -Discontinued Seroquel 50 mg at bedtime -Practice sleep hygiene, specifically going to be later  Virtual Visit via Video Note  I connected with Argentina Donovan on 08/21/21 at  9:30 AM EST by a video enabled telemedicine application and verified that I am speaking with the correct person using two identifiers.  Location: Patient: home Provider: home office   I discussed the limitations of evaluation and management by telemedicine and the availability of in person appointments. The patient expressed understanding and agreed to proceed.  Follow Up Instructions: Follow up in one month   I discussed the assessment and treatment plan with the patient. The patient was provided an opportunity to ask questions and all were answered. The patient agreed with the plan and demonstrated an understanding of the instructions.   The patient was advised to call back or seek an in-person evaluation if the symptoms worsen or if the condition fails to improve as anticipated.  I provided 20 minutes of non-face-to-face time during this encounter.   Waylan Boga, NP   Waylan Boga, NP 12/12/20229:40 AM

## 2021-08-21 NOTE — Progress Notes (Signed)
GYNECOLOGY  VISIT  CC:   dysmenorrhea, heavy bleeding, fibroids  HPI: 40 y.o. [id number] Significant Other Black or African American female here for discussion of ultrasound done this summer and to discuss bleeding/cramping issues.  Bleeding is typically regular, about every 28 days.  Flow is 5-7 days with the first five being heavy bleeding.  She does have clotting.  She uses #5 pads and needs to change every 2 hours.  She does have a lot of cramping as well with her cycles.    Ultrasound was done 02/28/2021 and showed uterus measuring 11.2 x 6.8 x 7.1cm.  Volume was 226ml.  4.4cm 2.4cm and 1.8cm fibroids were noted.  Endometrium not well visualized.  Possible adenomyosis also present.  Ovaries were normal.  Reviewed images with pt.  D/w pt nature of fibroids and adenomyosis.  She is desirous of treatment.  Progesterone options, progesterone IUD, GNRH antagonist treatment for fibroids, RFA treatment of fibroids, endometrial ablation, [place] and hysterectomy all discussed.  Pt is not a candidate for combination OCPs as she is a smoker at this time.  Pt is sick and tired of bleeding and leaning towards hysterectomy.  We briefly discussed risks, hospital stay and recovery.  She wants to talk with significant other.  May need a virtual visit/follow up visit if he has lots of questions.    Pt does have hx of iron deficiency anemia and is on iron.  Hb has responded to the iron and is improved.    GYNECOLOGIC HISTORY: Patient's last menstrual period was 08/11/2021 (exact date). Contraception: none  Patient Active Problem List   Diagnosis Date Noted   Bipolar affective disorder, depressed, moderate ([place]) 03/16/2021   Iron deficiency anemia 12/29/2020   Dysmenorrhea 12/26/2020   GAD (generalized anxiety disorder) 12/26/2020   Contraception management 07/23/2017   History of C-section 05/06/2017   Low vitamin D level 12/13/2016   OBESITY 02/12/2008   Tobacco dependence 02/12/2008    Past Medical  History:  Diagnosis Date   Anxiety    ARTHRITIS, RIGHT KNEE 08/16/2008   Qualifier: Diagnosis of  By: Moshe Cipro MD, [place]     Depression    Dysmenorrhea     Past Surgical History:  Procedure Laterality Date   CESAREAN SECTION  2000   CHOLECYSTECTOMY  2002    MEDS:   Current Outpatient Medications on File Prior to Visit  Medication Sig Dispense Refill   famotidine (PEPCID) 20 MG tablet Take 1 tablet (20 mg total) by mouth 2 (two) times daily. 30 tablet 0   ferrous sulfate 325 (65 FE) MG tablet Take 1 tablet (325 mg total) by mouth daily with breakfast. 100 tablet 1   hydrOXYzine (VISTARIL) 25 MG capsule TAKE 1 CAPSULE BY MOUTH TWICE DAILY AS NEEDED FOR ANXIETY MAY CAUSE DROWSINESS 30 capsule 0   ibuprofen (ADVIL) 200 MG tablet Take 600 mg by mouth every 6 (six) hours as needed for fever, headache or mild pain.     naproxen sodium (ALEVE) 220 MG tablet Take 440-660 mg by mouth 2 (two) times daily as needed (headache/pain/fever).     promethazine (PHENERGAN) 25 MG tablet Take 1 tablet (25 mg total) by mouth every 6 (six) hours as needed for nausea or vomiting. 10 tablet 0   dicyclomine (BENTYL) 20 MG tablet Take 1 tablet (20 mg total) by mouth 2 (two) times daily. (Patient not taking: Reported on 08/21/2021) 10 tablet 0   No current facility-administered medications on file prior to visit.    ALLERGIES:  Patient has no known allergies.  Family History  Problem Relation Age of Onset   Hypertension Mother    Diabetes Mother    Heart disease Mother     SH:  has significant other, smokes cigarettes  Review of Systems  Genitourinary:        Menstrual bleeding issues Pain with menstrual cycles   PHYSICAL EXAMINATION:    BP 120/66   Pulse 68   Ht 5' (1.524 m)   Wt 193 lb 3.2 oz (87.6 kg)   LMP 08/11/2021 (Exact Date)   Breastfeeding No   BMI 37.73 kg/m     Physical Exam Constitutional:      Appearance: Normal appearance.  Neurological:     General: No focal deficit  present.     Mental Status: She is alert.  Psychiatric:        Mood and Affect: Mood normal.        Behavior: Behavior normal.    Assessment/Plan: 1. Intramural uterine fibroid - treatment options discussed and documented in note today.  Pt is going to consider options and let me know thoughts or other questions.  2. Adenomyosis  3. Iron deficiency anemia due to chronic blood loss - continue oral iron  4. Dysmenorrhea  5. Smoking  6. History of C-section

## 2021-09-05 ENCOUNTER — Encounter (HOSPITAL_BASED_OUTPATIENT_CLINIC_OR_DEPARTMENT_OTHER): Payer: Self-pay | Admitting: Obstetrics & Gynecology

## 2021-09-14 ENCOUNTER — Ambulatory Visit (HOSPITAL_COMMUNITY): Payer: 59 | Admitting: Licensed Clinical Social Worker

## 2021-09-19 ENCOUNTER — Telehealth (HOSPITAL_COMMUNITY): Payer: Self-pay | Admitting: Psychiatry

## 2021-09-29 ENCOUNTER — Institutional Professional Consult (permissible substitution) (HOSPITAL_BASED_OUTPATIENT_CLINIC_OR_DEPARTMENT_OTHER): Payer: 59 | Admitting: Obstetrics & Gynecology

## 2021-11-20 ENCOUNTER — Ambulatory Visit: Payer: 59 | Admitting: Internal Medicine

## 2021-11-30 ENCOUNTER — Encounter (HOSPITAL_BASED_OUTPATIENT_CLINIC_OR_DEPARTMENT_OTHER): Payer: Self-pay

## 2021-11-30 ENCOUNTER — Other Ambulatory Visit: Payer: Self-pay

## 2021-11-30 ENCOUNTER — Ambulatory Visit (INDEPENDENT_AMBULATORY_CARE_PROVIDER_SITE_OTHER): Payer: 59 | Admitting: Obstetrics & Gynecology

## 2021-11-30 ENCOUNTER — Encounter (HOSPITAL_BASED_OUTPATIENT_CLINIC_OR_DEPARTMENT_OTHER): Payer: Self-pay | Admitting: Obstetrics & Gynecology

## 2021-11-30 VITALS — BP 116/76 | HR 75 | Ht 60.0 in | Wt 186.6 lb

## 2021-11-30 DIAGNOSIS — D251 Intramural leiomyoma of uterus: Secondary | ICD-10-CM

## 2021-11-30 DIAGNOSIS — N8003 Adenomyosis of the uterus: Secondary | ICD-10-CM | POA: Diagnosis not present

## 2021-11-30 DIAGNOSIS — Z1231 Encounter for screening mammogram for malignant neoplasm of breast: Secondary | ICD-10-CM

## 2021-11-30 DIAGNOSIS — Z862 Personal history of diseases of the blood and blood-forming organs and certain disorders involving the immune mechanism: Secondary | ICD-10-CM

## 2021-11-30 DIAGNOSIS — Z98891 History of uterine scar from previous surgery: Secondary | ICD-10-CM

## 2021-11-30 DIAGNOSIS — N920 Excessive and frequent menstruation with regular cycle: Secondary | ICD-10-CM

## 2021-11-30 NOTE — Progress Notes (Signed)
GYNECOLOGY  VISIT ? ?CC:   discuss surgery ? ?HPI: ?41 y.o. [id number] Significant Other Black or African American female here for discussion of treatment options for bleeding, fibroid uterus.  Pt seen 08/21/2021 with initial visit.  At that time, she report bleeding was regular with flow last 5-7 days and flow being very heavy during 5 days of her cycle.  She is using overnight, large pads and having to change every 2 hours on these days.  She was anemic about a year ago and has been on iron.  Anemia has resolved.  Imaging about 9 months ago showed uterus measuring 11.2 x 6.8 x 7.1cm with fibroid noted.  Largest measured 4.4cm.  At appointment in December, we discussed treatment options and she has decided to proceed with hysterectomy.  Hormonal methods for treatment as well as [place] and myomectomy discussed as well.  Additional questions answered today.   ? ?GYNECOLOGIC HISTORY: ?Patient's last menstrual period was 11/11/2021. ?Contraception: none ? ?Patient Active Problem List  ? Diagnosis Date Noted  ? Bipolar affective disorder, depressed, moderate ([place]) 03/16/2021  ? Iron deficiency anemia 12/29/2020  ? Dysmenorrhea 12/26/2020  ? GAD (generalized anxiety disorder) 12/26/2020  ? Contraception management 07/23/2017  ? History of C-section 05/06/2017  ? Low vitamin D level 12/13/2016  ? OBESITY 02/12/2008  ? Tobacco dependence 02/12/2008  ? ? ?Past Medical History:  ?Diagnosis Date  ? Anxiety   ? ARTHRITIS, RIGHT KNEE 08/16/2008  ? Qualifier: Diagnosis of  By: Moshe Cipro MD, Joycelyn Schmid    ? Bipolar 1 disorder ([place])   ? Depression   ? Dysmenorrhea   ? ? ?Past Surgical History:  ?Procedure Laterality Date  ? CESAREAN SECTION  2000  ? CHOLECYSTECTOMY  2002  ? ? ?MEDS:   ?Current Outpatient Medications on File Prior to Visit  ?Medication Sig Dispense Refill  ? ferrous sulfate 325 (65 FE) MG tablet Take 1 tablet (325 mg total) by mouth daily with breakfast. 100 tablet 1  ? gabapentin (NEURONTIN) 100 MG capsule Take 1 capsule  (100 mg total) by mouth 3 (three) times daily. 90 capsule 3  ? hydrOXYzine (VISTARIL) 25 MG capsule TAKE 1 CAPSULE BY MOUTH TWICE DAILY AS NEEDED FOR ANXIETY MAY CAUSE DROWSINESS 30 capsule 0  ? ibuprofen (ADVIL) 200 MG tablet Take 600 mg by mouth every 6 (six) hours as needed for fever, headache or mild pain.    ? naproxen sodium (ALEVE) 220 MG tablet Take 440-660 mg by mouth 2 (two) times daily as needed (headache/pain/fever).    ? Oxcarbazepine (TRILEPTAL) 300 MG tablet Take 0.5 tablets (150 mg total) by mouth 2 (two) times daily. 60 tablet 3  ? promethazine (PHENERGAN) 25 MG tablet Take 1 tablet (25 mg total) by mouth every 6 (six) hours as needed for nausea or vomiting. 10 tablet 0  ? sertraline (ZOLOFT) 100 MG tablet Take 1 tablet (100 mg total) by mouth daily. 30 tablet 2  ? traZODone (DESYREL) 50 MG tablet Take 1 tablet (50 mg total) by mouth at bedtime. 30 tablet 2  ? dicyclomine (BENTYL) 20 MG tablet Take 1 tablet (20 mg total) by mouth 2 (two) times daily. (Patient not taking: Reported on 08/21/2021) 10 tablet 0  ? famotidine (PEPCID) 20 MG tablet Take 1 tablet (20 mg total) by mouth 2 (two) times daily. (Patient not taking: Reported on 11/30/2021) 30 tablet 0  ? ?No current facility-administered medications on file prior to visit.  ? ? ?ALLERGIES: Patient has no known allergies. ? ?  Family History  ?Problem Relation Age of Onset  ? Hypertension Mother   ? Diabetes Mother   ? Heart disease Mother   ? ? ?SH:  with significant other, smoker ? ?Review of Systems  ?Genitourinary:   ?     Menorrhagia with regular cycle  ? ?PHYSICAL EXAMINATION:   ? ?BP 116/76 (BP Location: Left Arm, Patient Position: Sitting, Cuff Size: Large)   Pulse 75   Ht 5' (1.524 m) Comment: Reported  Wt 186 lb 9.6 oz (84.6 kg)   LMP 11/11/2021 Comment: Patient states she has been bleeding since 11/11/2021  BMI 36.44 kg/m?     ?General appearance: alert, cooperative and appears stated age ?No other exam performed  today ? ?Assessment/Plan: ?1. Menorrhagia with regular cycle ?- discussed treatment with TLH, bilateral salpingectomy, cystoscopy will be planned.  Procedure reviewed.  Risks, benefits reviewed again today with pt. ?- Communication sent to scheduler for posting surgery ? ?2. Intramural uterine fibroid ? ?3. Adenomyosis ? ?4. Iron deficiency anemia due to chronic blood loss ?- pt is to continue her iron orally until after surgery ? ?5. History of C-section ? ?6. Encounter for screening mammogram for malignant neoplasm of breast ?- encouraged pt to have this done prior to surgery ?- MM 3D SCREEN BREAST BILATERAL; Future  ? ?7.  Smoker ?- pt encouraged to cut back or stop if possible due to increased VTE risk related to surgery ? ? ?

## 2021-12-03 DIAGNOSIS — N8003 Adenomyosis of the uterus: Secondary | ICD-10-CM | POA: Insufficient documentation

## 2021-12-03 DIAGNOSIS — N920 Excessive and frequent menstruation with regular cycle: Secondary | ICD-10-CM | POA: Insufficient documentation

## 2021-12-03 DIAGNOSIS — D251 Intramural leiomyoma of uterus: Secondary | ICD-10-CM | POA: Insufficient documentation

## 2021-12-06 ENCOUNTER — Other Ambulatory Visit (HOSPITAL_BASED_OUTPATIENT_CLINIC_OR_DEPARTMENT_OTHER): Payer: Self-pay | Admitting: Obstetrics & Gynecology

## 2021-12-06 ENCOUNTER — Ambulatory Visit
Admission: EM | Admit: 2021-12-06 | Discharge: 2021-12-06 | Disposition: A | Discharge status: Home / Self Care | Payer: 59 | Attending: Internal Medicine | Admitting: Internal Medicine

## 2021-12-06 ENCOUNTER — Other Ambulatory Visit: Payer: Self-pay

## 2021-12-06 ENCOUNTER — Ambulatory Visit: Payer: Self-pay | Admitting: *Deleted

## 2021-12-06 DIAGNOSIS — L239 Allergic contact dermatitis, unspecified cause: Secondary | ICD-10-CM | POA: Diagnosis not present

## 2021-12-06 DIAGNOSIS — Z01818 Encounter for other preprocedural examination: Secondary | ICD-10-CM

## 2021-12-06 DIAGNOSIS — R21 Rash and other nonspecific skin eruption: Secondary | ICD-10-CM | POA: Diagnosis not present

## 2021-12-06 DIAGNOSIS — N920 Excessive and frequent menstruation with regular cycle: Secondary | ICD-10-CM

## 2021-12-06 MED ORDER — PREDNISONE 20 MG PO TABS: 40.0000 mg | ORAL_TABLET | Freq: Every day | ORAL | 0 refills | Status: AC

## 2021-12-06 NOTE — Telephone Encounter (Signed)
Reason for Disposition ? [1] Widespread hives, itching or facial swelling AND [2] onset > 2 hours after exposure to high-risk allergen (e.g., sting, nuts, 1st dose of antibiotic) ? ?Answer Assessment - Initial Assessment Questions ?1. APPEARANCE: "What does the rash look like?"  ?    I have hives/whelps all over my body.    I got a different detergent with a different odor and got a new dog and a new soap.    I went to a new house visiting a client recently.    ?It could be the detergent or the dog.    ?No shortness of breath or chest tightness.    My nose is stopped up last night but now it's runny.   ?It looks like someone hit me with a belt and whelps.   ?2. LOCATION: "Where is the rash located?"  ?    It's on my both hands, wrists, back,top of my butt, and now on my legs and hipps.   ?3. NUMBER: "How many hives are there?"  ?    Many ?4. SIZE: "How big are the hives?" (inches, cm, compare to coins) "Do they all look the same or is there lots of variation in shape and size?"  ?    *No Answer* ?5. ONSET: "When did the hives begin?" (Hours or days ago)  ?    2 days ago.  It started with my neck.  ?6. ITCHING: "Does it itch?" If Yes, ask: "How bad is the itch?"  ?  - MILD: doesn't interfere with normal activities ?  - MODERATE-SEVERE: interferes with work, school, sleep, or other activities  ?    Yes ?7. RECURRENT PROBLEM: "Have you had hives before?" If Yes, ask: "When was the last time?" and "What happened that time?"  ?    I had hives as a little girl when I would get upset.   This feels the same but a little different. ?8. TRIGGERS: "Were you exposed to any new food, plant, cosmetic product or animal just before the hives began?" ?    Yes above ?9. OTHER SYMPTOMS: "Do you have any other symptoms?" (e.g., fever, tongue swelling, difficulty breathing, abdominal pain) ?    No ?I took a Benadryl  last night which helped the itching.    ?10. PREGNANCY: "Is there any chance you are pregnant?" "When was your last  menstrual period?" ?      Not asked ? ?Protocols used: Hives-A-AH ? ?

## 2021-12-06 NOTE — ED Triage Notes (Signed)
Pt c/o linear and almost webb-like erythremic rashes growing across chest, abd, arms. States has had lots of changes lately including new detergents, and a pet. Pt not sure what may be cause. Onset ~ 2 days ago. Tried benadryl and hydrocortisone with some mild relief.  ?

## 2021-12-06 NOTE — Telephone Encounter (Signed)
?  Chief Complaint: Hives and itching all over ?Symptoms: above ?Frequency:  ?Pertinent Negatives: Patient denies chest pain or shortness of breath ?Disposition: '[]'$ ED /'[x]'$ Urgent Care (no appt availability in office) / '[]'$ Appointment(In office/virtual)/ '[]'$  [facility]/ '[]'$ Home Care/ '[]'$ Refused Recommended Disposition /'[]'$ [facility]/ '[]'$  Follow-up with PCP ?Additional Notes   Going to Primary Care at [facility].   Selina Cooley from [facility] let me know via a Teams message that service is available today.   Pt agreeable to going there now. ?

## 2021-12-06 NOTE — Discharge Instructions (Signed)
It appears that you are having an allergic reaction.  This is being treated with prednisone.  Please follow-up if symptoms persist or worsen. ?

## 2021-12-06 NOTE — ED Provider Notes (Signed)
?[facility] ? ? ? ?CSN: [id number] ?Arrival date & time: 12/06/21  8850 ? ? ?  ? ?History   ?Chief Complaint ?Chief Complaint  ?Patient presents with  ? Allergic Reaction  ? ? ?HPI ?Brianna Sampson is a 41 y.o. female.  ? ?Patient presents with a rash to chest, abdomen, back, bilateral lower extremities, bilateral lower extremities that started approximately 2 days ago.  Patient thinks that she is having allergic reaction because she has changed several things in her environment lately including detergent and has had a new pet.  Although, she is not sure what she could be having a reaction to.  Rash is itchy but not painful.  Denies any fevers. ? ? ?Allergic Reaction ? ?Past Medical History:  ?Diagnosis Date  ? Anxiety   ? ARTHRITIS, RIGHT KNEE 08/16/2008  ? Qualifier: Diagnosis of  By: Moshe Cipro MD, Joycelyn Schmid    ? Bipolar 1 disorder ([place])   ? Depression   ? Dysmenorrhea   ? ? ?Patient Active Problem List  ? Diagnosis Date Noted  ? Menorrhagia with regular cycle 12/03/2021  ? Intramural uterine fibroid 12/03/2021  ? Adenomyosis 12/03/2021  ? Bipolar affective disorder, depressed, moderate ([place]) 03/16/2021  ? History of iron deficiency anemia 12/29/2020  ? Dysmenorrhea 12/26/2020  ? GAD (generalized anxiety disorder) 12/26/2020  ? Contraception management 07/23/2017  ? History of C-section 05/06/2017  ? Low vitamin D level 12/13/2016  ? OBESITY 02/12/2008  ? Tobacco dependence 02/12/2008  ? ? ?Past Surgical History:  ?Procedure Laterality Date  ? CESAREAN SECTION  2000  ? CHOLECYSTECTOMY  2002  ? ? ?OB History   ? ? Gravida  ?8  ? Para  ?5  ? Term  ?5  ? Preterm  ?   ? AB  ?3  ? Living  ?5  ?  ? ? SAB  ?1  ? IAB  ?2  ? Ectopic  ?   ? Multiple  ?0  ? Live Births  ?5  ?   ?  ?  ? ? ? ?Home Medications   ? ?Prior to Admission medications   ?Medication Sig Start Date End Date Taking? Authorizing Provider  ?predniSONE (DELTASONE) 20 MG tablet Take 2 tablets (40 mg total) by mouth daily for 5 days. 12/06/21 12/11/21  Yes , Michele Rockers, FNP  ?dicyclomine (BENTYL) 20 MG tablet Take 1 tablet (20 mg total) by mouth 2 (two) times daily. ?Patient not taking: Reported on 08/21/2021 05/18/21   Delia Heady, PA-C  ?famotidine (PEPCID) 20 MG tablet Take 1 tablet (20 mg total) by mouth 2 (two) times daily. ?Patient not taking: Reported on 11/30/2021 05/18/21   Delia Heady, PA-C  ?ferrous sulfate 325 (65 FE) MG tablet Take 1 tablet (325 mg total) by mouth daily with breakfast. 12/29/20   Ladell Pier, MD  ?gabapentin (NEURONTIN) 100 MG capsule Take 1 capsule (100 mg total) by mouth 3 (three) times daily. 08/21/21 08/21/22  Patrecia Pour, NP  ?hydrOXYzine (VISTARIL) 25 MG capsule TAKE 1 CAPSULE BY MOUTH TWICE DAILY AS NEEDED FOR ANXIETY MAY CAUSE DROWSINESS 06/01/21   Ladell Pier, MD  ?ibuprofen (ADVIL) 200 MG tablet Take 600 mg by mouth every 6 (six) hours as needed for fever, headache or mild pain.    [provider]  ?naproxen sodium (ALEVE) 220 MG tablet Take 440-660 mg by mouth 2 (two) times daily as needed (headache/pain/fever).    [provider]  ?Oxcarbazepine (TRILEPTAL) 300 MG tablet Take 0.5  tablets (150 mg total) by mouth 2 (two) times daily. 08/21/21   Patrecia Pour, NP  ?promethazine (PHENERGAN) 25 MG tablet Take 1 tablet (25 mg total) by mouth every 6 (six) hours as needed for nausea or vomiting. 05/18/21   Khatri, Hina, PA-C  ?sertraline (ZOLOFT) 100 MG tablet Take 1 tablet (100 mg total) by mouth daily. 08/21/21 08/21/22  Patrecia Pour, NP  ?traZODone (DESYREL) 50 MG tablet Take 1 tablet (50 mg total) by mouth at bedtime. 08/21/21   Patrecia Pour, NP  ? ? ?Family History ?Family History  ?Problem Relation Age of Onset  ? Hypertension Mother   ? Diabetes Mother   ? Heart disease Mother   ? ? ?Social History ?Social History  ? ?Tobacco Use  ? Smoking status: Every Day  ?  Packs/day: 0.50  ?  Types: Cigarettes  ? Smokeless tobacco: Never  ? Tobacco comments:  ?  Pt declined  ?Vaping Use  ?  Vaping Use: Never used  ?Substance Use Topics  ? Alcohol use: Not Currently  ?  Alcohol/week: 2.0 standard drinks  ?  Types: 2 Glasses of wine per week  ?  Comment: occasional  ? Drug use: Yes  ?  Types: Marijuana  ?  Comment: 1 x daily. 1.5 grams  ? ? ? ?Allergies   ?Patient has no known allergies. ? ? ?Review of Systems ?Review of Systems ?Per HPI ? ?Physical Exam ?Triage Vital Signs ?ED Triage Vitals  ?Enc Vitals Group  ?   BP 12/06/21 1002 114/78  ?   Pulse Rate 12/06/21 1002 77  ?   Resp 12/06/21 1002 18  ?   Temp 12/06/21 1002 98.3 ?F (36.8 ?C)  ?   Temp Source 12/06/21 1002 Oral  ?   SpO2 12/06/21 1002 96 %  ?   Weight --   ?   Height --   ?   Head Circumference --   ?   Peak Flow --   ?   Pain Score 12/06/21 1003 0  ?   Pain Loc --   ?   Pain Edu? --   ?   Excl. in [place]? --   ? ?No data found. ? ?Updated Vital Signs ?BP 114/78 (BP Location: Left Arm)   Pulse 77   Temp 98.3 ?F (36.8 ?C) (Oral)   Resp 18   LMP 11/11/2021   SpO2 96%  ? ?Visual Acuity ?Right Eye Distance:   ?Left Eye Distance:   ?Bilateral Distance:   ? ?Right Eye Near:   ?Left Eye Near:    ?Bilateral Near:    ? ?Physical Exam ?Constitutional:   ?   General: She is not in acute distress. ?   Appearance: Normal appearance. She is not toxic-appearing or diaphoretic.  ?HENT:  ?   Head: Normocephalic and atraumatic.  ?Eyes:  ?   Extraocular Movements: Extraocular movements intact.  ?   Conjunctiva/sclera: Conjunctivae normal.  ?Pulmonary:  ?   Effort: Pulmonary effort is normal.  ?Skin: ?   Comments: Diffuse maculopapular rash present throughout bilateral upper chest, abdomen, bilateral upper extremities, back, bilateral lower extremities.  ?Neurological:  ?   General: No focal deficit present.  ?   Mental Status: She is alert and oriented to person, place, and time. Mental status is at baseline.  ?Psychiatric:     ?   Mood and Affect: Mood normal.     ?   Behavior: Behavior normal.     ?  Thought Content: Thought content normal.     ?    Judgment: Judgment normal.  ? ? ? ?UC Treatments / Results  ?Labs ?(all labs ordered are listed, but only abnormal results are displayed) ?Labs Reviewed - No data to display ? ?EKG ? ? ?Radiology ?No results found. ? ?Procedures ?Procedures (including critical care time) ? ?Medications Ordered in UC ?Medications - No data to display ? ?Initial Impression / Assessment and Plan / UC Course  ?I have reviewed the triage vital signs and the nursing notes. ? ?Pertinent labs & imaging results that were available during my care of the patient were reviewed by me and considered in my medical decision making (see chart for details). ? ?  ? ?Physical exam is consistent with allergic contact dermatitis.  Will treat with prednisone steroid given that rash is diffuse.  Discussed return precautions.  Patient verbalized understanding and was agreeable with plan. ?Final Clinical Impressions(s) / UC Diagnoses  ? ?Final diagnoses:  ?Allergic contact dermatitis, unspecified trigger  ?Rash and nonspecific skin eruption  ? ? ? ?Discharge Instructions   ? ?  ?It appears that you are having an allergic reaction.  This is being treated with prednisone.  Please follow-up if symptoms persist or worsen. ? ? ? ?ED Prescriptions   ? ? Medication Sig Dispense Auth. Provider  ? predniSONE (DELTASONE) 20 MG tablet Take 2 tablets (40 mg total) by mouth daily for 5 days. 10 tablet Oswaldo Conroy E, [place]  ? ?  ? ?PDMP not reviewed this encounter. ?  ?Teodora Medici, [place] ?12/06/21 1036 ? ?

## 2022-01-15 ENCOUNTER — Encounter (HOSPITAL_BASED_OUTPATIENT_CLINIC_OR_DEPARTMENT_OTHER): Payer: Self-pay | Admitting: Obstetrics & Gynecology

## 2022-01-16 ENCOUNTER — Other Ambulatory Visit: Payer: Self-pay

## 2022-01-16 ENCOUNTER — Encounter (HOSPITAL_BASED_OUTPATIENT_CLINIC_OR_DEPARTMENT_OTHER): Payer: Self-pay | Admitting: Obstetrics & Gynecology

## 2022-01-16 NOTE — Progress Notes (Addendum)
Spoke w/ via phone for pre-op interview---Harjit ?Lab needs dos----urine pregnancy POCT             ?Lab results------01/19/22 lab appt for CBC, type & screen, 05/18/21 CT a/p in Epic ?COVID test -----patient states asymptomatic no test needed ?Arrive at -------0530 on Tuesday, Jan 23, 2022 ?NPO after MN NO Solid Food.  Clear liquids from MN until---0430 ?Med rec completed ?Medications to take morning of surgery -----Neurontin, Hydroxyzine, Trileptal, Bentyl prn, Zoloft, Phenergan prn, famotidine prn ?Diabetic medication -----n/a ?Patient instructed no nail polish to be worn day of surgery ?Patient instructed to bring photo id and insurance card day of surgery ?Patient aware to have Driver (ride ) / caregiver    for 24 hours after surgery - son, Elberta Fortis ?Patient Special Instructions -----Extended stay / overnight instructions given. ?Pre-Op special Istructions -----none ?Patient verbalized understanding of instructions that were given at this phone interview. ?Patient denies shortness of breath, chest pain, fever, cough at this phone interview.  ?

## 2022-01-16 NOTE — Progress Notes (Signed)
? ? Your procedure is scheduled on Tuesday, Jan 23, 2022. ? Report to [facility] ? Call this number if you have problems the morning of surgery  :[phone]. ? ? [facility] [place].  WE ARE LOCATED IN [facility]. ? ?PLEASE BRING YOUR INSURANCE CARD AND PHOTO ID DAY OF SURGERY. ? ?ONLY 2 PEOPLE ARE ALLOWED IN  WAITING  ROOM.  ?                                   ? REMEMBER: ? DO NOT EAT FOOD, CANDY GUM OR MINTS  AFTER MIDNIGHT THE NIGHT BEFORE YOUR SURGERY . YOU MAY HAVE CLEAR LIQUIDS FROM MIDNIGHT THE NIGHT BEFORE YOUR SURGERY UNTIL  4:30 AM. NO CLEAR LIQUIDS AFTER   4:30 AM DAY OF SURGERY. ? ?YOU MAY  BRUSH YOUR TEETH MORNING OF SURGERY AND RINSE YOUR MOUTH OUT, NO CHEWING GUM CANDY OR MINTS. ? ? ? ? ?CLEAR LIQUID DIET ? ? ?Foods Allowed                                                                     Foods Excluded ? ?Coffee and tea, regular and decaf                             liquids that you cannot  ?Plain Jell-O                                                                   see through such as: ?Fruit ices (not with fruit pulp)                                     milk, soups, orange juice  ?Plain  Popsicles                                    All solid food ?Carbonated beverages, regular and diet                                    ?Cranberry, grape and apple juices ?Sports drinks like Gatorade ?_____________________________________________________________________ ?  ? ? TAKE THESE MEDICATIONS MORNING OF SURGERY: Neurontin (gabapentin), Vistaril (hydroxyzine) if needed, Trileptal (oxcarbazepine), Bentyl (dicyclomine) if needed, Zoloft (sertraline), Phenergan (promethazine) if needed, Pepcid (famotidine) if needed ? ? ? UP TO 4 VISITORS  MAY VISIT IN THE EXTENDED RECOVERY ROOM UNTIL 800 PM ONLY.  1 VISITOR AGE 43 AND OVER MAY SPEND THE NIGHT AND MUST BE IN EXTENDED RECOVERY ROOM NO LATER THAN 800 PM . YOUR DISCHARGE TIME AFTER YOU  SPEND THE NIGHT  IS 900 AM THE MORNING AFTER YOUR SURGERY. ? ?YOU MAY PACK A SMALL OVERNIGHT BAG WITH TOILETRIES FOR YOUR OVERNIGHT STAY IF YOU WISH. ? ?YOUR PRESCRIPTION MEDICATIONS WILL BE PROVIDED DURING [facility]. ? ? ?                                   ?DO NOT WEAR JEWERLY, MAKE UP. ?DO NOT WEAR LOTIONS, POWDERS, PERFUMES OR NAIL POLISH ON YOUR FINGERNAILS. TOENAIL POLISH IS OK TO WEAR. ?DO NOT SHAVE FOR 48 HOURS PRIOR TO DAY OF SURGERY. ?MEN MAY SHAVE FACE AND NECK. ?CONTACTS, GLASSES, OR DENTURES MAY NOT BE WORN TO SURGERY. ? ?REMEMBER: NO SMOKING, DRUGS OR ALCOHOL FOR 24 HOURS BEFORE YOUR SURGERY. ?                                   ?[facility] IS NOT RESPONSIBLE  FOR ANY BELONGINGS.                                  ?                                  . ?          [facility] - Preparing for Surgery ?Before surgery, you can play an important role.  Because skin is not sterile, your skin needs to be as free of germs as possible.  You can reduce the number of germs on your skin by washing with CHG (chlorahexidine gluconate) soap before surgery.  CHG is an antiseptic cleaner which kills germs and bonds with the skin to continue killing germs even after washing. ?Please DO NOT use if you have an allergy to CHG or antibacterial soaps.  If your skin becomes reddened/irritated stop using the CHG and inform your nurse when you arrive at Short Stay. ?Do not shave (including legs and underarms) for at least 48 hours prior to the first CHG shower.  You may shave your face/neck. ?Please follow these instructions carefully: ? 1.  Shower with CHG Soap the night before surgery and the  morning of Surgery. ? 2.  If you choose to wash your hair, wash your hair first as usual with your  normal  shampoo. ? 3.  After you shampoo, rinse your hair and body thoroughly to remove the  shampoo.                            ?4.  Use CHG as you would any other liquid soap.  You can apply chg directly  to the skin and wash ,  please wash your belly button thoroughly with chg soap provided night before and morning of your surgery. ?                    Gently with a scrungie or clean washcloth. ? 5.  Apply the CHG Soap to your body ONLY FROM THE NECK DOWN.   Do not use on face/ open      ?                     Wound or open sores. Avoid  contact with eyes, ears mouth and genitals (private parts).  ?                     Production manager,  Genitals (private parts) with your normal soap. ?            6.  Wash thoroughly, paying special attention to the area where your surgery  will be performed. ? 7.  Thoroughly rinse your body with warm water from the neck down. ? 8.  DO NOT shower/wash with your normal soap after using and rinsing off  the CHG Soap. ?               9.  Pat yourself dry with a clean towel. ?           10.  Wear clean pajamas. ?           11.  Place clean sheets on your bed the night of your first shower and do not  sleep with pets. ?Day of Surgery : ?Do not apply any lotions/deodorants the morning of surgery.  Please wear clean clothes to the hospital/surgery center. ? ?IF YOU HAVE ANY SKIN IRRITATION OR PROBLEMS WITH THE SURGICAL SOAP, PLEASE GET A BAR OF GOLD DIAL SOAP AND SHOWER THE NIGHT BEFORE YOUR SURGERY AND THE MORNING OF YOUR SURGERY. PLEASE LET THE NURSE KNOW MORNING OF YOUR SURGERY IF YOU HAD ANY PROBLEMS WITH THE SURGICAL SOAP. ? ? ?________________________________________________________________________                  ?                                    ?  QUESTIONS CALL Alzora Ha PRE OP NURSE PHONE [phone].                                    ?

## 2022-01-17 ENCOUNTER — Encounter (HOSPITAL_BASED_OUTPATIENT_CLINIC_OR_DEPARTMENT_OTHER): Payer: Self-pay | Admitting: Obstetrics & Gynecology

## 2022-01-17 ENCOUNTER — Ambulatory Visit (INDEPENDENT_AMBULATORY_CARE_PROVIDER_SITE_OTHER): Payer: 59 | Admitting: Obstetrics & Gynecology

## 2022-01-17 VITALS — BP 117/58 | HR 72 | Ht 59.0 in | Wt 183.6 lb

## 2022-01-17 DIAGNOSIS — N920 Excessive and frequent menstruation with regular cycle: Secondary | ICD-10-CM

## 2022-01-17 DIAGNOSIS — Z862 Personal history of diseases of the blood and blood-forming organs and certain disorders involving the immune mechanism: Secondary | ICD-10-CM

## 2022-01-17 DIAGNOSIS — N8003 Adenomyosis of the uterus: Secondary | ICD-10-CM | POA: Diagnosis not present

## 2022-01-17 DIAGNOSIS — F3132 Bipolar disorder, current episode depressed, moderate: Secondary | ICD-10-CM

## 2022-01-17 DIAGNOSIS — D251 Intramural leiomyoma of uterus: Secondary | ICD-10-CM

## 2022-01-19 ENCOUNTER — Ambulatory Visit (HOSPITAL_BASED_OUTPATIENT_CLINIC_OR_DEPARTMENT_OTHER): Admission: RE | Admit: 2022-01-19 | Payer: 59 | Source: Ambulatory Visit | Admitting: Radiology

## 2022-01-19 ENCOUNTER — Encounter (HOSPITAL_COMMUNITY)
Admission: RE | Admit: 2022-01-19 | Discharge: 2022-01-19 | Disposition: A | Discharge status: Home / Self Care | Payer: 59 | Source: Ambulatory Visit | Attending: Obstetrics & Gynecology | Admitting: Obstetrics & Gynecology

## 2022-01-19 DIAGNOSIS — Z01812 Encounter for preprocedural laboratory examination: Secondary | ICD-10-CM | POA: Diagnosis present

## 2022-01-19 DIAGNOSIS — N920 Excessive and frequent menstruation with regular cycle: Secondary | ICD-10-CM | POA: Insufficient documentation

## 2022-01-19 DIAGNOSIS — Z01818 Encounter for other preprocedural examination: Secondary | ICD-10-CM

## 2022-01-19 LAB — CBC
HCT: 40.9 % (ref 36.0–46.0)
Hemoglobin: 13.5 g/dL (ref 12.0–15.0)
MCH: 31.6 pg (ref 26.0–34.0)
MCHC: 33 g/dL (ref 30.0–36.0)
MCV: 95.8 fL (ref 80.0–100.0)
Platelets: 302 10*3/uL (ref 150–400)
RBC: 4.27 MIL/uL (ref 3.87–5.11)
RDW: 14.5 % (ref 11.5–15.5)
WBC: 8.3 10*3/uL (ref 4.0–10.5)
nRBC: 0 % (ref 0.0–0.2)

## 2022-01-20 NOTE — Progress Notes (Signed)
41 y.o. [id number] Significant Other Black or African American female here for discussion of upcoming procedure.  TLH with bilateral salpingectomy/cystoscopy, possible oophorectomy planned due to symptomatic uterine fibroids and menorrhagia.  Menstrual cycles are regular and last 5-7 days.  The first five days are completely heavy.  Had anemia about a year ago but has been on iron and anemia resolved.  Ultrasound done in 2022 showed uterus measuring 11.2 x 6.8 x 7.1cm with fibroids noted.  Largest was 4.4cm.  Last pap 05/22/2021.  Pt and I have discussed treatment options including endometrial ablation, progesterone methods, myomectomy but she is desirous of definitive treatment.   ? ?Procedure discussed with patient.  Hospital stay, recovery and pain management all discussed.  Risks discussed including but not limited to bleeding, 1% risk of receiving a  transfusion, infection, 3-4% risk of bowel/bladder/ureteral/vascular injury discussed as well as possible need for additional surgery if injury does occur discussed.  DVT/PE and rare risk of death discussed.  My actual complications with prior surgeries discussed.  Vaginal cuff dehiscence discussed.  Hernia formation discussed.  Positioning and incision locations discussed.  Patient aware if pathology abnormal she may need additional treatment.  All questions answered.    ? ?Ob Hx:   ?Patient's last menstrual period was 12/23/2021 (exact date).          ?Sexually active: Yes.   Birth control: none ?Last pap: 05/2021 ?Last MMG: scheduled for 02/09/2022 ?Smoking: Yes  ? ?Past Surgical History:  ?Procedure Laterality Date  ? CESAREAN SECTION  2000  ? CHOLECYSTECTOMY  2002  ? ? ?Past Medical History:  ?Diagnosis Date  ? Anemia   ? taking oral iron supplements as of 01/16/2022  ? Anxiety   ? ARTHRITIS, RIGHT KNEE 08/16/2008  ? Qualifier: Diagnosis of  By: Moshe Cipro MD, Joycelyn Schmid    ? Bipolar 1 disorder ([place])   ? Depression   ? Dysmenorrhea   ? GERD (gastroesophageal reflux disease)    ? occasional  ? ? ?Allergies: Patient has no known allergies. ? ?Current Outpatient Medications  ?Medication Sig Dispense Refill  ? dicyclomine (BENTYL) 20 MG tablet Take 1 tablet (20 mg total) by mouth 2 (two) times daily. (Patient taking differently: Take 20 mg by mouth as needed.) 10 tablet 0  ? famotidine (PEPCID) 20 MG tablet Take 1 tablet (20 mg total) by mouth 2 (two) times daily. (Patient taking differently: Take 20 mg by mouth as needed.) 30 tablet 0  ? ferrous sulfate 325 (65 FE) MG tablet Take 1 tablet (325 mg total) by mouth daily with breakfast. 100 tablet 1  ? gabapentin (NEURONTIN) 100 MG capsule Take 1 capsule (100 mg total) by mouth 3 (three) times daily. 90 capsule 3  ? hydrOXYzine (VISTARIL) 25 MG capsule TAKE 1 CAPSULE BY MOUTH TWICE DAILY AS NEEDED FOR ANXIETY MAY CAUSE DROWSINESS 30 capsule 0  ? ibuprofen (ADVIL) 200 MG tablet Take 600 mg by mouth every 6 (six) hours as needed for fever, headache or mild pain.    ? Oxcarbazepine (TRILEPTAL) 300 MG tablet Take 0.5 tablets (150 mg total) by mouth 2 (two) times daily. 60 tablet 3  ? sertraline (ZOLOFT) 100 MG tablet Take 1 tablet (100 mg total) by mouth daily. 30 tablet 2  ? ?No current facility-administered medications for this visit.  ? ? ?ROS: Pertinent items noted in HPI and remainder of comprehensive ROS otherwise negative. ? ?Exam:   ?BP (!) 117/58 (BP Location: Right Arm, Patient Position: Sitting, Cuff Size: Large)  Pulse 72   Ht '4\' 11"'$  (1.499 m) Comment: reported  Wt 183 lb 9.6 oz (83.3 kg)   LMP 12/23/2021 (Exact Date)   BMI 37.08 kg/m?   ?General appearance: alert and cooperative ?Head: Normocephalic, without obvious abnormality, atraumatic ?Neck: no adenopathy, supple, symmetrical, trachea midline and thyroid not enlarged, symmetric, no tenderness/mass/nodules ?Lungs: clear to auscultation bilaterally ?Heart: regular rate and rhythm, S1, S2 normal, no murmur, click, rub or gallop ?Extremities: extremities normal, atraumatic, no  cyanosis or edema ?Skin: Skin color, texture, turgor normal. No rashes or lesions ?Neurologic: Grossly normal ? ?Pelvic: no pelvic exam performed today ? ? ?Assessment/Plan: ?1. Menorrhagia with regular cycle ?- Surgery is scheduled for next week ?- pre op orders have been placed ?- Pre and post op instructions reviewed ?- Post op pain medications reviewed ?- Medications/Vitamins reviewed.  ? ?2. Intramural uterine fibroid ? ?3. Adenomyosis ? ?4. Bipolar affective disorder, depressed, moderate ([place]) ? ?5. History of iron deficiency anemia  ? ? ? ? ?

## 2022-01-23 ENCOUNTER — Ambulatory Visit (HOSPITAL_BASED_OUTPATIENT_CLINIC_OR_DEPARTMENT_OTHER)
Admission: RE | Admit: 2022-01-23 | Discharge: 2022-01-23 | Disposition: A | Discharge status: Home / Self Care | Payer: 59 | Source: Ambulatory Visit | Attending: Obstetrics & Gynecology | Admitting: Obstetrics & Gynecology

## 2022-01-23 ENCOUNTER — Other Ambulatory Visit (HOSPITAL_BASED_OUTPATIENT_CLINIC_OR_DEPARTMENT_OTHER): Payer: Self-pay | Admitting: Obstetrics & Gynecology

## 2022-01-23 ENCOUNTER — Other Ambulatory Visit (HOSPITAL_COMMUNITY): Payer: Self-pay

## 2022-01-23 ENCOUNTER — Other Ambulatory Visit: Payer: Self-pay

## 2022-01-23 ENCOUNTER — Ambulatory Visit (HOSPITAL_BASED_OUTPATIENT_CLINIC_OR_DEPARTMENT_OTHER): Payer: 59 | Admitting: Anesthesiology

## 2022-01-23 ENCOUNTER — Encounter (HOSPITAL_BASED_OUTPATIENT_CLINIC_OR_DEPARTMENT_OTHER): Payer: Self-pay | Admitting: Obstetrics & Gynecology

## 2022-01-23 ENCOUNTER — Encounter (HOSPITAL_BASED_OUTPATIENT_CLINIC_OR_DEPARTMENT_OTHER)
Admission: RE | Disposition: A | Discharge status: Home / Self Care | Payer: Self-pay | Source: Ambulatory Visit | Attending: Obstetrics & Gynecology

## 2022-01-23 DIAGNOSIS — D259 Leiomyoma of uterus, unspecified: Secondary | ICD-10-CM

## 2022-01-23 DIAGNOSIS — D509 Iron deficiency anemia, unspecified: Secondary | ICD-10-CM | POA: Diagnosis not present

## 2022-01-23 DIAGNOSIS — N888 Other specified noninflammatory disorders of cervix uteri: Secondary | ICD-10-CM | POA: Diagnosis not present

## 2022-01-23 DIAGNOSIS — F319 Bipolar disorder, unspecified: Secondary | ICD-10-CM | POA: Insufficient documentation

## 2022-01-23 DIAGNOSIS — K219 Gastro-esophageal reflux disease without esophagitis: Secondary | ICD-10-CM | POA: Insufficient documentation

## 2022-01-23 DIAGNOSIS — Z862 Personal history of diseases of the blood and blood-forming organs and certain disorders involving the immune mechanism: Secondary | ICD-10-CM

## 2022-01-23 DIAGNOSIS — D251 Intramural leiomyoma of uterus: Secondary | ICD-10-CM

## 2022-01-23 DIAGNOSIS — F419 Anxiety disorder, unspecified: Secondary | ICD-10-CM | POA: Diagnosis not present

## 2022-01-23 DIAGNOSIS — Z79899 Other long term (current) drug therapy: Secondary | ICD-10-CM | POA: Insufficient documentation

## 2022-01-23 DIAGNOSIS — N8003 Adenomyosis of the uterus: Secondary | ICD-10-CM | POA: Diagnosis not present

## 2022-01-23 DIAGNOSIS — F17200 Nicotine dependence, unspecified, uncomplicated: Secondary | ICD-10-CM | POA: Diagnosis not present

## 2022-01-23 DIAGNOSIS — N920 Excessive and frequent menstruation with regular cycle: Secondary | ICD-10-CM | POA: Insufficient documentation

## 2022-01-23 DIAGNOSIS — Z01818 Encounter for other preprocedural examination: Secondary | ICD-10-CM

## 2022-01-23 HISTORY — PX: CYSTOSCOPY: SHX5120

## 2022-01-23 HISTORY — DX: Gastro-esophageal reflux disease without esophagitis: K21.9

## 2022-01-23 HISTORY — PX: TOTAL LAPAROSCOPIC HYSTERECTOMY WITH SALPINGECTOMY: SHX6742

## 2022-01-23 HISTORY — DX: Anemia, unspecified: D64.9

## 2022-01-23 LAB — TYPE AND SCREEN
ABO/RH(D): B POS
Antibody Screen: NEGATIVE

## 2022-01-23 LAB — HEMOGLOBIN: Hemoglobin: 12.8 g/dL (ref 12.0–15.0)

## 2022-01-23 LAB — POCT PREGNANCY, URINE: Preg Test, Ur: NEGATIVE

## 2022-01-23 SURGERY — HYSTERECTOMY, TOTAL, LAPAROSCOPIC, WITH SALPINGECTOMY
Anesthesia: General

## 2022-01-23 MED ORDER — OXYCODONE-ACETAMINOPHEN 5-325 MG PO TABS
1.0000 | ORAL_TABLET | Freq: Four times a day (QID) | ORAL | 0 refills | Status: AC | PRN
  Filled 2022-01-23: qty 30, 4d supply, fill #0

## 2022-01-23 MED ORDER — KETOROLAC TROMETHAMINE 30 MG/ML IJ SOLN
30.0000 mg | Freq: Four times a day (QID) | INTRAMUSCULAR | Status: DC
  Administered 2022-01-23: 30 mg via INTRAVENOUS

## 2022-01-23 MED ORDER — OXYCODONE-ACETAMINOPHEN 5-325 MG PO TABS
1.0000 | ORAL_TABLET | ORAL | Status: DC | PRN
  Administered 2022-01-23 (×2): 1 via ORAL

## 2022-01-23 MED ORDER — KETOROLAC TROMETHAMINE 30 MG/ML IJ SOLN
INTRAMUSCULAR | Status: AC
  Filled 2022-01-23: qty 1

## 2022-01-23 MED ORDER — SODIUM CHLORIDE 0.9 % IV SOLN
INTRAVENOUS | Status: DC | PRN
  Administered 2022-01-23: 60 mL

## 2022-01-23 MED ORDER — FENTANYL CITRATE (PF) 100 MCG/2ML IJ SOLN: 25.0000 ug | INTRAMUSCULAR | Status: DC | PRN

## 2022-01-23 MED ORDER — DEXAMETHASONE SODIUM PHOSPHATE 10 MG/ML IJ SOLN
INTRAMUSCULAR | Status: AC
Start: 2022-01-23 — End: ?
  Filled 2022-01-23: qty 1

## 2022-01-23 MED ORDER — OXYCODONE-ACETAMINOPHEN 5-325 MG PO TABS
ORAL_TABLET | ORAL | Status: AC
Start: 2022-01-23 — End: ?
  Filled 2022-01-23: qty 1

## 2022-01-23 MED ORDER — ENOXAPARIN SODIUM 40 MG/0.4ML IJ SOSY
PREFILLED_SYRINGE | INTRAMUSCULAR | Status: AC
  Filled 2022-01-23: qty 0.4

## 2022-01-23 MED ORDER — OXYCODONE HCL 5 MG PO TABS: 5.0000 mg | ORAL_TABLET | Freq: Once | ORAL | Status: DC | PRN

## 2022-01-23 MED ORDER — LIDOCAINE HCL (PF) 2 % IJ SOLN
INTRAMUSCULAR | Status: AC
  Filled 2022-01-23: qty 5

## 2022-01-23 MED ORDER — SCOPOLAMINE 1 MG/3DAYS TD PT72
MEDICATED_PATCH | TRANSDERMAL | Status: AC
  Filled 2022-01-23: qty 1

## 2022-01-23 MED ORDER — GABAPENTIN 100 MG PO CAPS
100.0000 mg | ORAL_CAPSULE | Freq: Three times a day (TID) | ORAL | Status: DC
  Administered 2022-01-23: 100 mg via ORAL

## 2022-01-23 MED ORDER — ALUM & MAG HYDROXIDE-SIMETH 200-200-20 MG/5ML PO SUSP: 30.0000 mL | ORAL | Status: DC | PRN

## 2022-01-23 MED ORDER — SCOPOLAMINE 1 MG/3DAYS TD PT72
1.0000 | MEDICATED_PATCH | TRANSDERMAL | Status: DC
  Administered 2022-01-23: 1.5 mg via TRANSDERMAL

## 2022-01-23 MED ORDER — PROPOFOL 10 MG/ML IV BOLUS
INTRAVENOUS | Status: AC
Start: 2022-01-23 — End: ?
  Filled 2022-01-23: qty 20

## 2022-01-23 MED ORDER — MORPHINE SULFATE (PF) 4 MG/ML IV SOLN: 1.0000 mg | INTRAVENOUS | Status: DC | PRN

## 2022-01-23 MED ORDER — IBUPROFEN 200 MG PO TABS: 600.0000 mg | ORAL_TABLET | Freq: Four times a day (QID) | ORAL | Status: DC

## 2022-01-23 MED ORDER — DEXAMETHASONE SODIUM PHOSPHATE 10 MG/ML IJ SOLN
INTRAMUSCULAR | Status: AC
  Filled 2022-01-23: qty 1

## 2022-01-23 MED ORDER — FENTANYL CITRATE (PF) 100 MCG/2ML IJ SOLN
INTRAMUSCULAR | Status: DC | PRN
Start: 2022-01-23 — End: 2022-01-23
  Administered 2022-01-23 (×3): 50 ug via INTRAVENOUS
  Administered 2022-01-23: 25 ug via INTRAVENOUS
  Administered 2022-01-23 (×2): 50 ug via INTRAVENOUS

## 2022-01-23 MED ORDER — ENOXAPARIN SODIUM 40 MG/0.4ML IJ SOSY
40.0000 mg | PREFILLED_SYRINGE | INTRAMUSCULAR | Status: AC
  Administered 2022-01-23: 40 mg via SUBCUTANEOUS

## 2022-01-23 MED ORDER — PROPOFOL 1000 MG/100ML IV EMUL
INTRAVENOUS | Status: AC
  Filled 2022-01-23: qty 100

## 2022-01-23 MED ORDER — ROCURONIUM BROMIDE 10 MG/ML (PF) SYRINGE
PREFILLED_SYRINGE | INTRAVENOUS | Status: AC
  Filled 2022-01-23: qty 10

## 2022-01-23 MED ORDER — POVIDONE-IODINE 10 % EX SWAB
2.0000 "application " | Freq: Once | CUTANEOUS | Status: AC
  Administered 2022-01-23: 2 via TOPICAL

## 2022-01-23 MED ORDER — ACETAMINOPHEN 500 MG PO TABS
1000.0000 mg | ORAL_TABLET | ORAL | Status: AC
  Administered 2022-01-23: 1000 mg via ORAL

## 2022-01-23 MED ORDER — MIDAZOLAM HCL 2 MG/2ML IJ SOLN
INTRAMUSCULAR | Status: AC
  Filled 2022-01-23: qty 2

## 2022-01-23 MED ORDER — PROPOFOL 10 MG/ML IV BOLUS
INTRAVENOUS | Status: DC | PRN
  Administered 2022-01-23: 200 mg via INTRAVENOUS

## 2022-01-23 MED ORDER — DEXMEDETOMIDINE (PRECEDEX) IN NS 20 MCG/5ML (4 MCG/ML) IV SYRINGE
PREFILLED_SYRINGE | INTRAVENOUS | Status: DC | PRN
  Administered 2022-01-23 (×3): 4 ug via INTRAVENOUS

## 2022-01-23 MED ORDER — ARTIFICIAL TEARS OPHTHALMIC OINT
TOPICAL_OINTMENT | OPHTHALMIC | Status: AC
  Filled 2022-01-23: qty 3.5

## 2022-01-23 MED ORDER — ONDANSETRON HCL 4 MG/2ML IJ SOLN
INTRAMUSCULAR | Status: AC
  Filled 2022-01-23: qty 2

## 2022-01-23 MED ORDER — KETOROLAC TROMETHAMINE 30 MG/ML IJ SOLN
INTRAMUSCULAR | Status: DC | PRN
  Administered 2022-01-23: 30 mg via INTRAVENOUS

## 2022-01-23 MED ORDER — ACETAMINOPHEN 500 MG PO TABS
ORAL_TABLET | ORAL | Status: AC
  Filled 2022-01-23: qty 2

## 2022-01-23 MED ORDER — DEXTROSE-NACL 5-0.45 % IV SOLN: INTRAVENOUS | Status: DC

## 2022-01-23 MED ORDER — GABAPENTIN 100 MG PO CAPS
ORAL_CAPSULE | ORAL | Status: AC
  Filled 2022-01-23: qty 1

## 2022-01-23 MED ORDER — BUPIVACAINE HCL (PF) 0.25 % IJ SOLN
INTRAMUSCULAR | Status: DC | PRN
  Administered 2022-01-23: 17 mL

## 2022-01-23 MED ORDER — FENTANYL CITRATE (PF) 100 MCG/2ML IJ SOLN
INTRAMUSCULAR | Status: AC
Start: 2022-01-23 — End: ?
  Filled 2022-01-23: qty 2

## 2022-01-23 MED ORDER — EPHEDRINE 5 MG/ML INJ
INTRAVENOUS | Status: AC
  Filled 2022-01-23: qty 5

## 2022-01-23 MED ORDER — ONDANSETRON HCL 4 MG/2ML IJ SOLN
INTRAMUSCULAR | Status: DC | PRN
  Administered 2022-01-23: 4 mg via INTRAVENOUS

## 2022-01-23 MED ORDER — EPHEDRINE SULFATE-NACL 50-0.9 MG/10ML-% IV SOSY
PREFILLED_SYRINGE | INTRAVENOUS | Status: DC | PRN
  Administered 2022-01-23 (×2): 10 mg via INTRAVENOUS

## 2022-01-23 MED ORDER — LACTATED RINGERS IV SOLN: INTRAVENOUS | Status: DC

## 2022-01-23 MED ORDER — SIMETHICONE 80 MG PO CHEW: 80.0000 mg | CHEWABLE_TABLET | Freq: Four times a day (QID) | ORAL | Status: DC | PRN

## 2022-01-23 MED ORDER — AMISULPRIDE (ANTIEMETIC) 5 MG/2ML IV SOLN: 10.0000 mg | Freq: Once | INTRAVENOUS | Status: DC | PRN

## 2022-01-23 MED ORDER — DEXAMETHASONE SODIUM PHOSPHATE 10 MG/ML IJ SOLN
INTRAMUSCULAR | Status: DC | PRN
  Administered 2022-01-23: 10 mg via INTRAVENOUS

## 2022-01-23 MED ORDER — KETOROLAC TROMETHAMINE 30 MG/ML IJ SOLN: 30.0000 mg | Freq: Once | INTRAMUSCULAR | Status: DC | PRN

## 2022-01-23 MED ORDER — ONDANSETRON HCL 4 MG/2ML IJ SOLN: 4.0000 mg | Freq: Four times a day (QID) | INTRAMUSCULAR | Status: DC | PRN

## 2022-01-23 MED ORDER — SODIUM CHLORIDE 0.9 % IV SOLN
INTRAVENOUS | Status: AC
  Filled 2022-01-23: qty 2

## 2022-01-23 MED ORDER — LIDOCAINE 2% (20 MG/ML) 5 ML SYRINGE
INTRAMUSCULAR | Status: DC | PRN
  Administered 2022-01-23: 60 mg via INTRAVENOUS

## 2022-01-23 MED ORDER — OXYCODONE HCL 5 MG/5ML PO SOLN: 5.0000 mg | Freq: Once | ORAL | Status: DC | PRN

## 2022-01-23 MED ORDER — SODIUM CHLORIDE 0.9 % IV SOLN
INTRAVENOUS | Status: AC | PRN
  Administered 2022-01-23 (×2): 1000 mL

## 2022-01-23 MED ORDER — ONDANSETRON HCL 4 MG PO TABS: 4.0000 mg | ORAL_TABLET | Freq: Four times a day (QID) | ORAL | Status: DC | PRN

## 2022-01-23 MED ORDER — PROPOFOL 10 MG/ML IV BOLUS
INTRAVENOUS | Status: AC
  Filled 2022-01-23: qty 20

## 2022-01-23 MED ORDER — ROCURONIUM BROMIDE 10 MG/ML (PF) SYRINGE
PREFILLED_SYRINGE | INTRAVENOUS | Status: DC | PRN
  Administered 2022-01-23 (×2): 20 mg via INTRAVENOUS
  Administered 2022-01-23: 50 mg via INTRAVENOUS
  Administered 2022-01-23: 10 mg via INTRAVENOUS

## 2022-01-23 MED ORDER — IBUPROFEN 800 MG PO TABS
800.0000 mg | ORAL_TABLET | Freq: Three times a day (TID) | ORAL | 0 refills | Status: DC | PRN
  Filled 2022-01-23: qty 30, 10d supply, fill #0

## 2022-01-23 MED ORDER — FENTANYL CITRATE (PF) 250 MCG/5ML IJ SOLN
INTRAMUSCULAR | Status: AC
  Filled 2022-01-23: qty 5

## 2022-01-23 MED ORDER — SODIUM CHLORIDE 0.9 % IV SOLN
2.0000 g | INTRAVENOUS | Status: AC
  Administered 2022-01-23: 2 g via INTRAVENOUS

## 2022-01-23 MED ORDER — MIDAZOLAM HCL 2 MG/2ML IJ SOLN
INTRAMUSCULAR | Status: DC | PRN
  Administered 2022-01-23: 2 mg via INTRAVENOUS

## 2022-01-23 MED ORDER — PANTOPRAZOLE SODIUM 40 MG IV SOLR: 40.0000 mg | Freq: Every day | INTRAVENOUS | Status: DC

## 2022-01-23 MED ORDER — MENTHOL 3 MG MT LOZG: 1.0000 | LOZENGE | OROMUCOSAL | Status: DC | PRN

## 2022-01-23 MED ORDER — SUGAMMADEX SODIUM 200 MG/2ML IV SOLN
INTRAVENOUS | Status: DC | PRN
Start: 2022-01-23 — End: 2022-01-23
  Administered 2022-01-23: 200 mg via INTRAVENOUS

## 2022-01-23 SURGICAL SUPPLY — 66 items
ADH SKN CLS APL DERMABOND .7 (GAUZE/BANDAGES/DRESSINGS) ×2
APL PRP STRL LF DISP 70% ISPRP (MISCELLANEOUS) ×2
APL SRG 38 LTWT LNG FL B (MISCELLANEOUS)
APPLICATOR ARISTA FLEXITIP XL (MISCELLANEOUS) IMPLANT
BLADE SURG 10 STRL SS (BLADE) IMPLANT
CABLE HIGH FREQUENCY MONO STRZ (ELECTRODE) IMPLANT
CELL SAVER LIPIGURD (MISCELLANEOUS) IMPLANT
CHLORAPREP W/TINT 26 (MISCELLANEOUS) ×3 IMPLANT
COVER BACK TABLE 60X90IN (DRAPES) ×3 IMPLANT
COVER MAYO STAND STRL (DRAPES) ×3 IMPLANT
COVER SURGICAL LIGHT HANDLE (MISCELLANEOUS) IMPLANT
DERMABOND ADVANCED (GAUZE/BANDAGES/DRESSINGS) ×1
DERMABOND ADVANCED .7 DNX12 (GAUZE/BANDAGES/DRESSINGS) ×2 IMPLANT
DEVICE RETRIEVAL ALEXIS 14 (MISCELLANEOUS) IMPLANT
DILATOR CANAL MILEX (MISCELLANEOUS) ×1 IMPLANT
DRSG COVADERM PLUS 2X2 (GAUZE/BANDAGES/DRESSINGS) IMPLANT
EXTRT SYSTEM ALEXIS 14CM (MISCELLANEOUS)
EXTRT SYSTEM ALEXIS 17CM (MISCELLANEOUS)
GAUZE 4X4 16PLY ~~LOC~~+RFID DBL (SPONGE) ×6 IMPLANT
GLOVE BIO SURGEON STRL SZ 6.5 (GLOVE) ×3 IMPLANT
GLOVE BIOGEL PI IND STRL 6.5 (GLOVE) ×2 IMPLANT
GLOVE BIOGEL PI IND STRL 7.0 (GLOVE) ×4 IMPLANT
GLOVE BIOGEL PI INDICATOR 6.5 (GLOVE) ×1
GLOVE BIOGEL PI INDICATOR 7.0 (GLOVE) ×3
GLOVE ECLIPSE 6.5 STRL STRAW (GLOVE) ×6 IMPLANT
GOWN STRL REUS W/TWL XL LVL3 (GOWN DISPOSABLE) ×8 IMPLANT
HEMOSTAT ARISTA ABSORB 3G PWDR (HEMOSTASIS) IMPLANT
KIT TURNOVER CYSTO (KITS) ×3 IMPLANT
LEGGING LITHOTOMY PAIR STRL (DRAPES) ×3 IMPLANT
LIGASURE VESSEL 5MM BLUNT TIP (ELECTROSURGICAL) ×3 IMPLANT
NEEDLE INSUFFLATION 120MM (ENDOMECHANICALS) ×3 IMPLANT
NS IRRIG 1000ML POUR BTL (IV SOLUTION) ×3 IMPLANT
OCCLUDER COLPOPNEUMO (BALLOONS) ×3 IMPLANT
PACK LAPAROSCOPY BASIN (CUSTOM PROCEDURE TRAY) ×3 IMPLANT
PACK TRENDGUARD 450 HYBRID PRO (MISCELLANEOUS) ×2 IMPLANT
PENCIL SMOKE EVACUATOR (MISCELLANEOUS) IMPLANT
POUCH LAPAROSCOPIC INSTRUMENT (MISCELLANEOUS) ×3 IMPLANT
PROTECTOR NERVE ULNAR (MISCELLANEOUS) ×4 IMPLANT
SCISSORS LAP 5X35 DISP (ENDOMECHANICALS) IMPLANT
SET IRRIG Y TYPE TUR BLADDER L (SET/KITS/TRAYS/PACK) ×3 IMPLANT
SET SUCTION IRRIG HYDROSURG (IRRIGATION / IRRIGATOR) ×3 IMPLANT
SET TRI-LUMEN FLTR TB AIRSEAL (TUBING) ×3 IMPLANT
SHEARS HARMONIC ACE PLUS 36CM (ENDOMECHANICALS) ×3 IMPLANT
SUT VIC AB 0 CT1 27 (SUTURE) ×6
SUT VIC AB 0 CT1 27XBRD ANBCTR (SUTURE) ×4 IMPLANT
SUT VIC AB 4-0 PS2 18 (SUTURE) ×3 IMPLANT
SUT VICRYL 0 UR6 27IN ABS (SUTURE) IMPLANT
SUT VLOC 180 0 9IN  GS21 (SUTURE) ×3
SUT VLOC 180 0 9IN GS21 (SUTURE) ×2 IMPLANT
SYR 10ML LL (SYRINGE) ×3 IMPLANT
SYR 50ML LL SCALE MARK (SYRINGE) ×6 IMPLANT
SYSTEM CARTER THOMASON II (TROCAR) IMPLANT
SYSTEM CONTND EXTRCTN KII BLLN (MISCELLANEOUS) IMPLANT
TIP UTERINE 5.1X6CM LAV DISP (MISCELLANEOUS) IMPLANT
TIP UTERINE 6.7X10CM GRN DISP (MISCELLANEOUS) ×1 IMPLANT
TIP UTERINE 6.7X6CM WHT DISP (MISCELLANEOUS) IMPLANT
TIP UTERINE 6.7X8CM BLUE DISP (MISCELLANEOUS) IMPLANT
TOWEL OR 17X26 10 PK STRL BLUE (TOWEL DISPOSABLE) ×6 IMPLANT
TRAY FOLEY W/BAG SLVR 14FR LF (SET/KITS/TRAYS/PACK) ×3 IMPLANT
TRENDGUARD 450 HYBRID PRO PACK (MISCELLANEOUS) ×3
TROCAR ADV FIXATION 5X100MM (TROCAR) ×3 IMPLANT
TROCAR BLADELESS OPT 5 100 (ENDOMECHANICALS) ×3 IMPLANT
TROCAR PORT AIRSEAL 5X120 (TROCAR) ×3 IMPLANT
TROCAR XCEL NON BLADE 8MM B8LT (ENDOMECHANICALS) ×3 IMPLANT
WARMER LAPAROSCOPE (MISCELLANEOUS) ×3 IMPLANT
WATER STERILE IRR 3000ML UROMA (IV SOLUTION) ×3 IMPLANT

## 2022-01-23 NOTE — Discharge Instructions (Signed)
Post Op Hysterectomy Instructions ?Please read the instructions below. Refer to these instructions for the next few weeks. These instructions provide you with general information on caring for yourself after surgery. Your caregiver may also give you specific instructions. While your treatment has been planned according to the most current medical practices available, unavoidable problems sometimes happen. If you have any problems or questions after you leave, please call your caregiver. ? ?HOME CARE INSTRUCTIONS ?Healing will take time. You will have discomfort, tenderness, swelling and bruising at the operative site for a couple of weeks. This is normal and will get better as time goes on.  ?Only take over-the-counter or prescription medicines for pain, discomfort or fever as directed by your caregiver.  ?Do not take aspirin. It can cause bleeding.  ?Do not drive when taking pain medication.  ?Follow your caregiver?s advice regarding diet, exercise, lifting, driving and general activities.  ?Resume your usual diet as directed and allowed.  ?Get plenty of rest and sleep.  ?Do not douche, use tampons, or have sexual intercourse until your caregiver gives you permission. Marland Kitchen  ?Take your temperature if you feel hot or flushed.  ?You may shower today when you get home.  No tub bath for one week.   ?Do not drink alcohol until you are not taking any narcotic pain medications.  ?Try to have someone home with you for a week or two to help with the household activities.   Be careful over the next two to three weeks with any activities at home that involve lifting, pushing, or pulling.  Listen to your body--if something feels uncomfortable to do, then don't do it. ?Make sure you and your family understands everything about your operation and recovery.  ?Walking up stairs is fine. ?Do not sign any legal documents until you feel normal again.  ?Keep all your follow-up appointments as recommended by your caregiver.  ? ?PLEASE CALL  THE OFFICE IF: ?There is swelling, redness or increasing pain in the wound area.  ?Pus is coming from the wound.  ?You notice a bad smell from the wound or surgical dressing.  ?You have pain, redness and swelling from the intravenous site.  ?The wound is breaking open (the edges are not staying together).   ?You develop pain or bleeding when you urinate.  ?You develop abnormal vaginal discharge.  ?You have any type of abnormal reaction or develop an allergy to your medication.  ?You need stronger pain medication for your pain  ? ?SEEK IMMEDIATE MEDICAL CARE: ?You develop a temperature of 100.5 or higher.  ?You develop abdominal pain.  ?You develop chest pain.  ?You develop shortness of breath.  ?You pass out.  ?You develop pain, swelling or redness of your leg.  ?You develop heavy vaginal bleeding with or without blood clots.  ? ?MEDICATIONS: ?Restart your regular medications BUT wait one week before restarting all vitamins and mineral supplements ?Use Motrin '800mg'$  every 8 hours for the next several days.  This will help you use less Percocet.  Use the Percocet 5/325 1-2 tabs every 4-6 hours as needed for pain.  Do NOT take any extra tylenol if you are taking Percocet. ?You may use an over the counter stool softener like Colace or Dulcolax to help with starting a bowel movement.  Start the day after you go home.  Warm liquids, fluids, and ambulation help too.  If you have not had a bowel movement in four days, you need to call the office.  ?

## 2022-01-23 NOTE — Transfer of Care (Signed)
Immediate Anesthesia Transfer of Care Note ? ?Patient: Brianna Sampson ? ?Procedure(s) Performed: Procedure(s) (LRB): ?TOTAL LAPAROSCOPIC HYSTERECTOMY WITH SALPINGECTOMY (Bilateral) ?CYSTOSCOPY (N/A) ? ?Patient Location: PACU ? ?Anesthesia Type: General ? ?Level of Consciousness: drowsy ? ?Airway & Oxygen Therapy: Patient Spontanous Breathing and Patient connected to face mask oxygen ? ?Post-op Assessment: Report given to PACU RN and Post -op Vital signs reviewed and stable ? ?Post vital signs: Reviewed and stable ? ?Complications: No apparent anesthesia complications ? ?Last Vitals:  ?Vitals Value Taken Time  ?BP 109/66 01/23/22 1145  ?Temp 36.4 ?C 01/23/22 1103  ?Pulse 78 01/23/22 1147  ?Resp 23 01/23/22 1147  ?SpO2 90 % 01/23/22 1147  ?Vitals shown include unvalidated device data. ? ?Last Pain:  ?Vitals:  ? 01/23/22 1130  ?TempSrc:   ?PainSc: Asleep  ?   ? ?Patients Stated Pain Goal: 7 (01/23/22 1103) ? ?Complications: No notable events documented. ?

## 2022-01-23 NOTE — H&P (Signed)
Brianna Sampson is an 41 y.o. female [name] with symptomatic uterine fibroids here for definitive therapy with TLH/bilateral salpingectomy/cystoscopy.  We have discussed risks and benefits and this is documented in prior noted.  Specifically, we have discussed risks of DVT/PE, MI, bowel/bladder/ureteral injury, death, infection, transfusion and major bleeding.  We have also dicussed alteratives including hormonal therapy, [place], myomectomy.  I do not think she is a good candidate for endometrial ablation.  Evaluation has included ultrasound showing uterus measuring 11.2 x 6.8 x 7.1cm with multiple fibroids and the largest measuring 4.4cm.  Questions answered this morning. Pt ready to proceed. ? ?Pertinent Gynecological History: ?Menses:  regular but heavy ?Contraception: none ?DES exposure: denies ?Blood transfusions:  none ?Sexually transmitted diseases: no past history ?Previous GYN Procedures:  cesarean section   ?Last mammogram:  never done one but scheduled for 02/09/2022   ?Last pap: normal Date: 05/22/2021 ?OB History: G8, P5  ?  ? ?Past Medical History:  ?Diagnosis Date  ? Anemia   ? taking oral iron supplements as of 01/16/2022  ? Anxiety   ? ARTHRITIS, RIGHT KNEE 08/16/2008  ? Qualifier: Diagnosis of  By: Moshe Cipro MD, Joycelyn Schmid    ? Bipolar 1 disorder ([place])   ? Depression   ? Dysmenorrhea   ? GERD (gastroesophageal reflux disease)   ? occasional  ? ? ?Past Surgical History:  ?Procedure Laterality Date  ? CESAREAN SECTION  2000  ? CHOLECYSTECTOMY  2002  ? ? ?Family History  ?Problem Relation Age of Onset  ? Hypertension Mother   ? Diabetes Mother   ? Heart disease Mother   ? ? ?Social History:  reports that she has been smoking cigarettes. She has been smoking an average of .25 packs per day. She has never used smokeless tobacco. She reports that she does not currently use alcohol. She reports current drug use. Drug: Marijuana. ? ?Allergies: No Known Allergies ? ?Medications Prior to Admission   ?Medication Sig Dispense Refill Last Dose  ? acetaminophen (TYLENOL) 325 MG tablet Take 650 mg by mouth every 6 (six) hours as needed.   01/22/2022  ? dicyclomine (BENTYL) 20 MG tablet Take 1 tablet (20 mg total) by mouth 2 (two) times daily. (Patient taking differently: Take 20 mg by mouth as needed.) 10 tablet 0 Past Month  ? famotidine (PEPCID) 20 MG tablet Take 1 tablet (20 mg total) by mouth 2 (two) times daily. (Patient taking differently: Take 20 mg by mouth as needed.) 30 tablet 0 Past Month  ? ferrous sulfate 325 (65 FE) MG tablet Take 1 tablet (325 mg total) by mouth daily with breakfast. 100 tablet 1 Past Week  ? gabapentin (NEURONTIN) 100 MG capsule Take 1 capsule (100 mg total) by mouth 3 (three) times daily. 90 capsule 3 01/22/2022  ? hydrOXYzine (VISTARIL) 25 MG capsule TAKE 1 CAPSULE BY MOUTH TWICE DAILY AS NEEDED FOR ANXIETY MAY CAUSE DROWSINESS 30 capsule 0 01/22/2022  ? ibuprofen (ADVIL) 200 MG tablet Take 600 mg by mouth every 6 (six) hours as needed for fever, headache or mild pain.   Past Week  ? Oxcarbazepine (TRILEPTAL) 300 MG tablet Take 0.5 tablets (150 mg total) by mouth 2 (two) times daily. 60 tablet 3 01/22/2022  ? sertraline (ZOLOFT) 100 MG tablet Take 1 tablet (100 mg total) by mouth daily. 30 tablet 2 01/22/2022  ? ? ?Review of Systems  ?Genitourinary:  Positive for menstrual problem.  ? ?Blood pressure 120/79, pulse 71, temperature 97.9 ?F (36.6 ?C),  temperature source Oral, resp. rate 16, height '4\' 11"'$  (1.499 m), weight 83.5 kg, last menstrual period 01/23/2022, SpO2 99 %. ?Physical Exam ?Constitutional:   ?   Appearance: Normal appearance.  ?Cardiovascular:  ?   Rate and Rhythm: Normal rate and regular rhythm.  ?Pulmonary:  ?   Effort: Pulmonary effort is normal.  ?   Breath sounds: Normal breath sounds.  ?Skin: ?   General: Skin is warm.  ?Neurological:  ?   Mental Status: She is alert.  ?Psychiatric:     ?   Mood and Affect: Mood normal.  ? ? ?Results for orders placed or performed  during the hospital encounter of 01/23/22 (from the past 24 hour(s))  ?Pregnancy, urine POC     Status: None  ? Collection Time: 01/23/22  5:47 AM  ?Result Value Ref Range  ? Preg Test, Ur NEGATIVE NEGATIVE  ? ? ?No results found. ? ?Assessment/Plan: ?41 yo G8 P5 [name] with symptomatic uterine fibroids here for definitive treatment with TLH/bilateral salpingectomy/cystoscopy, possible oophorectomy.  All questions answered.  Pt ready to proceed. ? ?Megan Salon ?01/23/2022, 7:15 AM ? ?

## 2022-01-23 NOTE — Op Note (Addendum)
01/23/2022 ? ?10:52 AM ? ?PATIENT:  Brianna Sampson  41 y.o. female ? ?PRE-OPERATIVE DIAGNOSIS:  Fibroids ?Menorrhagia, h/o iron deficiency ? ?POST-OPERATIVE DIAGNOSIS:  Fibroids  ?Menorrhagia ? ?PROCEDURE:  Procedure(s): ?TOTAL LAPAROSCOPIC HYSTERECTOMY WITH SALPINGECTOMY ?CYSTOSCOPY ? ?SURGEON:  Megan Salon ? ?ASSISTANTS: Darron Doom, MD.  An experienced assistant was required given the standard of surgical care given the complexity of the case.  This assistant was needed for exposure, dissection, suctioning, retraction, instrument exchange and for overall help during the procedure.  RNFA help was also unavailable. ? ?ANESTHESIA:   general ? ?ESTIMATED BLOOD LOSS: 50 mL ? ?BLOOD ADMINISTERED:none  ? ?FLUIDS: 1200cc LR ? ?UOP: 600cc ? ?SPECIMEN:  uterus, cervix and bilateral fallopian tubes ? ?DISPOSITION OF SPECIMEN:  PATHOLOGY ? ?FINDINGS: enlarged, fibroid uterus, normal appearing tubes and ovaries.  Normal appearing upper abdomen ? ?DESCRIPTION OF OPERATION: Patient is taken to the operating room. She is placed in the supine position. She is a running IV in place. Informed consent was present on the chart. SCDs on her lower extremities and functioning properly.  General endotracheal anesthesia was administered by the anesthesia staff without difficulty. Dr. Roanna Banning, anesthesia, oversaw case.  Patient was positioned  Her legs were placed in the low lithotomy position in [place]. Her arms were tucked by the side. Time out performed.   ? ?Chlora prep was then used to prep the abdomen and Betadine was used to prep the inner thighs, perineum and vagina. Once 3 minutes had past the patient was draped in a normal standard fashion. The legs were lifted to the high lithotomy position. The cervix was visualized by placing a heavy weighted speculum in the posterior aspect of the vagina and using a curved Deaver retractor to the retract anteriorly. The anterior lip of the cervix was grasped with single-tooth  tenaculum.  The uterus sounded to 11 cm. Pratt dilators were used to dilate the cervix up to a #21. A RUMI uterine manipulator was obtained. A #10 disposable tip was placed on the RUMI manipulator as well as a 3.5, silver KOH ring. This was passed through the cervix and the bulb of the disposable tip was inflated with 10 cc of normal saline. There was a good fit of the KOH ring around the cervix. The tenaculum was removed. There is also good manipulation of the uterus. The speculum and retractor were removed as well. A Foley catheter was placed to straight drain.  Clear urine was noted. Legs were lowered to the low lithotomy position and attention was turned the abdomen. ? ?The umbilicus was everted.  A Veress needle was obtained. Syringe of sterile saline was placed on a open Veress needle.  This was passed into the umbilicus until just when the fluid started to drip.  Then low flow CO2 gas was attached the needle and the pneumoperitoneum was achieved without difficulty. Once four liters of gas was in the abdomen the Veress needle was removed and a 5 millimeter non-bladed Optiview trocar and port were passed directly to the abdomen. The laparoscope was then used to confirm intraperitoneal placement. Findings included enlarged fibroid uterus.  Locations for RLQ, LLQ, and suprapubic ports were noted by transillumination of the abdominal wall.  0.25% marcaine was used to anesthetize the skin.  96m skin incision was made in the LLQ and an AirSeal port was placed underdirect visualization of the laparoscope.  Then a 550mskin incision was made and a 26m50monbladed trochar and port was placed in the  RLQ.  Finally, and 39m skin incision was made about 4cm above the pubic symphasis and an 883mnon-bladed port was placed with direct visualization of the laparoscope.  All trochars were removed.   ? ?Ureters were identifies.  Attention was turned to the left side. With uterus on stretch the left tube was excised off the ovary  and mesosalpinx was dissected to free the tube. Then the left utero-ovarian pedicle was serially clamped cauterized and incised using the ligasure device. Left round ligament was serially clamped cauterized and incised. The anterior and posterior peritoneum of the inferior leaf of the broad ligament were opened. The beginning of the bladder flap was created.  The bladder was taken down below the level of the KOH ring. The left uterine artery skeletonized and then just superior to the KOH ring this vessel was serially clamped, cauterized, and incised. ? ?Attention was turned the right side.  The uterus was placed on stretch to the opposite side.  The tube was excised off the ovary using sharp dissection a bipolar cautery.  The mesosalpinx was incised freeing the tube. Then the right uterine ovarian pedicle was serially clamped cauterized and incised. Next the right round ligament was serially clamped cauterized and incised. The anterior posterior peritoneum of the inferiorly for the broad ligament were opened. There were adhesions from her prior cesarean section so the bladder was backfilled.  Once the edge of the bladder was more easily seen, the anterior peritoneum was carried across to the dissection on the left side. The remainder of the bladder flap was created using sharp dissection. The bladder was well below the level of the KOH ring. The left uterine artery skeletonized. Then the left uterine artery, above the level of the KOH ring, was serially clamped cauterized and incised. The uterus was devascularized at this point. ? ?The colpotomy was performed a starting in the midline and using a harmonic scalpel with the inferior edge of the open blade  This was carried around a circumferential fashion until the vaginal mucosa was completely incised in the specimen was freed.  The specimen was then delivered to the vagina.  The uterus would not come out due to size to attention was turned to the vagina.  The uterus  was morcellated in the vagina without difficulty.  Then a vaginal occlusive device was used to maintain the pneumoperitoneum ? ?Instruments were changed with a needle driver and Kobra graspers.  Using a 9 inch V. lock suture, the cuff was closed by incorporating the anterior and posterior vaginal mucosa in each stitch. This was carried across all the way to the left corner and a running fashion. Two stitches were brought back towards the midline and the suture was cut flush with the vagina. The needle was brought out the pelvis. The pelvis was irrigated. All pedicles were inspected. No bleeding was noted.   Co2 pressures were lowered to 24m39mg.  Again, no bleeding was noted.  Ureters were noted deep in the pelvis to be peristalsing.  Arista was placed along the pedicles.  At this point the procedure was completed.  The remaining instruments were removed.  The ports (except the suprapubic port) were removed under direct visualization of the laparoscope and the pneumoperitoneum was relieved.  The patient was taken out of Trendelenburg positioning.  Several deep breaths were given to the patient's trying to any gas the abdomen and finally the suprapubic port was removed. ? ?The skin was then closed with subcuticular stitches of 3-0  Vicryl. The skin was cleansed Dermabond was applied. Attention was then turned the vagina and the cuff was inspected. No bleeding was noted. The anterior posterior vaginal mucosa was incorporated in each stitch. The Foley catheter was removed.  Cystoscopy was performed.  No sutures or bladder injuries were noted.  Ureters were noted with normal urine jets from each one was seen.  Foley was left out after the cystoscopic fluid was drained and cystoscope removed.  Sponge, lap, needle, initially counts were correct x2. Patient tolerated the procedure very well. She was awakened from anesthesia, extubated and taken to recovery in stable condition.  ? ?Uterine weight in OR: 280gram ? ? ?COUNTS:   YES ? ?PLAN OF CARE: Transfer to PACU ? ? ? ? ? ? ? ? ? ? ? ? ?  ? ? ?

## 2022-01-23 NOTE — Anesthesia Postprocedure Evaluation (Signed)
Anesthesia Post Note ? ?Patient: Brianna Sampson ? ?Procedure(s) Performed: TOTAL LAPAROSCOPIC HYSTERECTOMY WITH SALPINGECTOMY (Bilateral) ?CYSTOSCOPY ? ?  ? ?Patient location during evaluation: PACU ?Anesthesia Type: General ?Level of consciousness: awake ?Pain management: pain level controlled ?Vital Signs Assessment: post-procedure vital signs reviewed and stable ?Respiratory status: spontaneous breathing, nonlabored ventilation, respiratory function stable and patient connected to nasal cannula oxygen ?Cardiovascular status: blood pressure returned to baseline and stable ?Postop Assessment: no apparent nausea or vomiting ?Anesthetic complications: no ? ? ?No notable events documented. ? ?Last Vitals:  ?Vitals:  ? 01/23/22 1358 01/23/22 1803  ?BP: 111/90 122/84  ?Pulse: 72 71  ?Resp: 18 16  ?Temp: (!) 36.3 ?C 36.4 ?C  ?SpO2: 99% 98%  ?  ?Last Pain:  ?Vitals:  ? 01/23/22 2000  ?TempSrc:   ?PainSc: Asleep  ? ? ?  ?  ?  ?  ?  ?  ? ?Terra Aveni P Beya Tipps ? ? ? ? ?

## 2022-01-23 NOTE — Progress Notes (Signed)
Day of Surgery Procedure(s) (LRB): ?TOTAL LAPAROSCOPIC HYSTERECTOMY WITH SALPINGECTOMY (Bilateral) ?CYSTOSCOPY (N/A) ? ?Subjective: ?Patient reports no complaints.  Has voided, walked.  Has ordered food.  Has taken percocet only once for pain control.  Considering if wants to go home. ? ?Objective: ?I have reviewed patient's vital signs, intake and output, medications, and labs. ? ?General: alert and no distress ?Resp: clear to auscultation bilaterally ?Cardio: regular rate and rhythm, S1, S2 normal, no murmur, click, rub or gallop ?GI: soft, non-tender; bowel sounds normal; no masses,  no organomegaly ?Extremities: extremities normal, atraumatic, no cyanosis or edema ?Vaginal Bleeding: minimal ? ?Assessment: ?s/p Procedure(s): ?TOTAL LAPAROSCOPIC HYSTERECTOMY WITH SALPINGECTOMY (Bilateral) ?CYSTOSCOPY (N/A): progressing well ? ?Plan: ?Encourage ambulation ?If stays overnight, will repeat CBC in AM. ? LOS: 0 days  ? ? ?Megan Salon ?01/23/2022, 8:42 PM ? ? ? ? ?

## 2022-01-23 NOTE — Anesthesia Procedure Notes (Signed)
Procedure Name: Intubation ?Date/Time: 01/23/2022 7:37 AM ?Performed by: Mechele Claude, CRNA ?Pre-anesthesia Checklist: Patient identified, Emergency Drugs available, Suction available and Patient being monitored ?Patient Re-evaluated:Patient Re-evaluated prior to induction ?Oxygen Delivery Method: Circle system utilized ?Preoxygenation: Pre-oxygenation with 100% oxygen ?Induction Type: IV induction and Cricoid Pressure applied ?Ventilation: Mask ventilation without difficulty ?Laryngoscope Size: Mac and 3 ?Grade View: Grade II ?Tube type: Oral ?Tube size: 7.0 mm ?Number of attempts: 1 ?Airway Equipment and Method: Stylet and Oral airway ?Placement Confirmation: ETT inserted through vocal cords under direct vision, positive ETCO2 and breath sounds checked- equal and bilateral ?Secured at: 21 cm ?Tube secured with: Tape ?Dental Injury: Teeth and Oropharynx as per pre-operative assessment  ? ? ? ? ?

## 2022-01-23 NOTE — Anesthesia Preprocedure Evaluation (Addendum)
Anesthesia Evaluation  ?Patient identified by MRN, date of birth, ID band ?Patient awake ? ? ? ?Reviewed: ?Allergy & Precautions, NPO status , Patient's Chart, lab work & pertinent test results ? ?Airway ?Mallampati: I ? ?TM Distance: >3 FB ?Neck ROM: Full ? ? ? Dental ? ?(+) Loose,  ?  ?Pulmonary ?Current SmokerPatient did not abstain from smoking.,  ?  ?Pulmonary exam normal ? ? ? ? ? ? ? Cardiovascular ?negative cardio ROS ?Normal cardiovascular exam ? ? ?  ?Neuro/Psych ?PSYCHIATRIC DISORDERS Anxiety Depression Bipolar Disorder negative neurological ROS ?   ? GI/Hepatic ?GERD  Medicated and Controlled,(+)  ?  ? substance abuse ? ,   ?Endo/Other  ?negative endocrine ROS ? Renal/GU ?negative Renal ROS  ? ?  ?Musculoskeletal ?negative musculoskeletal ROS ?(+)  ? Abdominal ?(+) + obese,   ?Peds ? Hematology ?negative hematology ROS ?(+)   ?Anesthesia Other Findings ?Fibroids  ?Menorrhagia ? Reproductive/Obstetrics ?hcg negative ? ?  ? ? ? ? ? ? ? ? ? ? ? ? ? ?  ?  ? ? ? ? ? ? ? ?Anesthesia Physical ?Anesthesia Plan ? ?ASA: 2 ? ?Anesthesia Plan: General  ? ?Post-op Pain Management:   ? ?Induction: Intravenous ? ?PONV Risk Score and Plan: 3 and Ondansetron, Dexamethasone, Midazolam, Scopolamine patch - Pre-op and Treatment may vary due to age or medical condition ? ?Airway Management Planned: Oral ETT ? ?Additional Equipment:  ? ?Intra-op Plan:  ? ?Post-operative Plan: Extubation in OR ? ?Informed Consent: I have reviewed the patients History and Physical, chart, labs and discussed the procedure including the risks, benefits and alternatives for the proposed anesthesia with the patient or authorized representative who has indicated his/her understanding and acceptance.  ? ? ? ?Dental advisory given ? ?Plan Discussed with: CRNA ? ?Anesthesia Plan Comments:   ? ? ? ? ? ?Anesthesia Quick Evaluation ? ?

## 2022-01-24 ENCOUNTER — Encounter (HOSPITAL_BASED_OUTPATIENT_CLINIC_OR_DEPARTMENT_OTHER): Payer: Self-pay | Admitting: Obstetrics & Gynecology

## 2022-01-24 LAB — SURGICAL PATHOLOGY

## 2022-01-30 ENCOUNTER — Encounter (HOSPITAL_BASED_OUTPATIENT_CLINIC_OR_DEPARTMENT_OTHER): Payer: Self-pay | Admitting: Obstetrics & Gynecology

## 2022-01-30 ENCOUNTER — Ambulatory Visit (INDEPENDENT_AMBULATORY_CARE_PROVIDER_SITE_OTHER): Payer: 59 | Admitting: Obstetrics & Gynecology

## 2022-01-30 VITALS — BP 115/87 | HR 78 | Wt 182.8 lb

## 2022-01-30 DIAGNOSIS — Z9889 Other specified postprocedural states: Secondary | ICD-10-CM

## 2022-01-30 NOTE — Progress Notes (Signed)
GYNECOLOGY  VISIT  CC:   post op recheck  HPI: 41 y.o. [id number] Significant Other Black or African American female here for recheck after undergoing TLH/bilateral salpingectomy/cystoscopy on 01/23/2022.  She reports bleeding is none.  She has minimal pain.  Bowel function is Normal.  Bladder function is normal.    Pathology reviewed:  Yes .  Questions answered.    MEDS:   Current Outpatient Medications on File Prior to Visit  Medication Sig Dispense Refill   acetaminophen (TYLENOL) 325 MG tablet Take 650 mg by mouth every 6 (six) hours as needed.     dicyclomine (BENTYL) 20 MG tablet Take 1 tablet (20 mg total) by mouth 2 (two) times daily. (Patient taking differently: Take 20 mg by mouth as needed.) 10 tablet 0   famotidine (PEPCID) 20 MG tablet Take 1 tablet (20 mg total) by mouth 2 (two) times daily. (Patient taking differently: Take 20 mg by mouth as needed.) 30 tablet 0   ferrous sulfate 325 (65 FE) MG tablet Take 1 tablet (325 mg total) by mouth daily with breakfast. 100 tablet 1   hydrOXYzine (VISTARIL) 25 MG capsule TAKE 1 CAPSULE BY MOUTH TWICE DAILY AS NEEDED FOR ANXIETY MAY CAUSE DROWSINESS 30 capsule 0   ibuprofen (ADVIL) 800 MG tablet Take 1 tablet by mouth every 8 hours as needed for fever, headache or mild pain. 30 tablet 0   Oxcarbazepine (TRILEPTAL) 300 MG tablet Take 0.5 tablets (150 mg total) by mouth 2 (two) times daily. 60 tablet 3   sertraline (ZOLOFT) 100 MG tablet Take 1 tablet (100 mg total) by mouth daily. 30 tablet 2   No current facility-administered medications on file prior to visit.    SH:  Smoking No    PHYSICAL EXAMINATION:    BP 115/87 (BP Location: Right Arm, Patient Position: Sitting, Cuff Size: Large)   Pulse 78   Wt 182 lb 12.8 oz (82.9 kg)   LMP 01/23/2022 (Exact Date)   BMI 36.92 kg/m     General appearance: alert, cooperative and appears stated age CV:  Regular rate and rhythm Lungs:  clear to auscultation, no wheezes, rales or rhonchi,  symmetric air entry Abdomen: soft, non-tender; bowel sounds normal; no masses,  no organomegaly Incisions:  C/D/I   Assessment/Plan: 1. Post-operative state - recheck 4 weeks - restrictions discussed.  Pt can drive at this point.

## 2022-02-09 ENCOUNTER — Ambulatory Visit (HOSPITAL_BASED_OUTPATIENT_CLINIC_OR_DEPARTMENT_OTHER)
Admission: RE | Admit: 2022-02-09 | Discharge: 2022-02-09 | Disposition: A | Discharge status: Home / Self Care | Payer: 59 | Source: Ambulatory Visit | Attending: Obstetrics & Gynecology | Admitting: Obstetrics & Gynecology

## 2022-02-09 DIAGNOSIS — Z1231 Encounter for screening mammogram for malignant neoplasm of breast: Secondary | ICD-10-CM | POA: Diagnosis not present

## 2022-02-16 ENCOUNTER — Other Ambulatory Visit (HOSPITAL_COMMUNITY): Payer: Self-pay | Admitting: Psychiatry

## 2022-02-20 ENCOUNTER — Encounter (HOSPITAL_BASED_OUTPATIENT_CLINIC_OR_DEPARTMENT_OTHER): Payer: Self-pay | Admitting: Obstetrics & Gynecology

## 2022-03-02 ENCOUNTER — Ambulatory Visit (INDEPENDENT_AMBULATORY_CARE_PROVIDER_SITE_OTHER): Payer: 59 | Admitting: Obstetrics & Gynecology

## 2022-03-02 ENCOUNTER — Encounter (HOSPITAL_BASED_OUTPATIENT_CLINIC_OR_DEPARTMENT_OTHER): Payer: Self-pay | Admitting: Obstetrics & Gynecology

## 2022-03-02 VITALS — BP 121/79 | HR 75 | Wt 182.2 lb

## 2022-03-02 DIAGNOSIS — Z9889 Other specified postprocedural states: Secondary | ICD-10-CM

## 2022-03-05 ENCOUNTER — Ambulatory Visit: Payer: 59 | Attending: Internal Medicine | Admitting: Internal Medicine

## 2022-03-05 DIAGNOSIS — F330 Major depressive disorder, recurrent, mild: Secondary | ICD-10-CM | POA: Diagnosis not present

## 2022-03-05 DIAGNOSIS — F411 Generalized anxiety disorder: Secondary | ICD-10-CM | POA: Diagnosis not present

## 2022-03-05 MED ORDER — ESCITALOPRAM OXALATE 10 MG PO TABS: ORAL_TABLET | ORAL | 1 refills | Status: AC

## 2022-03-05 MED ORDER — HYDROXYZINE PAMOATE 25 MG PO CAPS: 25.0000 mg | ORAL_CAPSULE | Freq: Two times a day (BID) | ORAL | 1 refills | Status: AC | PRN

## 2022-03-12 ENCOUNTER — Ambulatory Visit (HOSPITAL_BASED_OUTPATIENT_CLINIC_OR_DEPARTMENT_OTHER): Payer: Self-pay | Admitting: Obstetrics & Gynecology

## 2022-03-30 ENCOUNTER — Encounter: Payer: Self-pay | Admitting: Internal Medicine

## 2023-02-18 ENCOUNTER — Other Ambulatory Visit (HOSPITAL_COMMUNITY)
Admission: RE | Admit: 2023-02-18 | Discharge: 2023-02-18 | Disposition: A | Discharge status: Home / Self Care | Payer: Medicaid Other | Source: Ambulatory Visit | Attending: Obstetrics & Gynecology | Admitting: Obstetrics & Gynecology

## 2023-02-18 ENCOUNTER — Encounter (HOSPITAL_BASED_OUTPATIENT_CLINIC_OR_DEPARTMENT_OTHER): Payer: Self-pay | Admitting: Obstetrics & Gynecology

## 2023-02-18 ENCOUNTER — Other Ambulatory Visit: Payer: Self-pay | Admitting: Obstetrics & Gynecology

## 2023-02-18 ENCOUNTER — Ambulatory Visit (INDEPENDENT_AMBULATORY_CARE_PROVIDER_SITE_OTHER): Payer: Self-pay | Admitting: Obstetrics & Gynecology

## 2023-02-18 VITALS — BP 125/65 | HR 87 | Ht 59.0 in | Wt 183.0 lb

## 2023-02-18 DIAGNOSIS — Z01419 Encounter for gynecological examination (general) (routine) without abnormal findings: Secondary | ICD-10-CM

## 2023-02-18 DIAGNOSIS — Z1231 Encounter for screening mammogram for malignant neoplasm of breast: Secondary | ICD-10-CM

## 2023-02-18 DIAGNOSIS — Z113 Encounter for screening for infections with a predominantly sexual mode of transmission: Secondary | ICD-10-CM | POA: Diagnosis not present

## 2023-02-18 DIAGNOSIS — N898 Other specified noninflammatory disorders of vagina: Secondary | ICD-10-CM

## 2023-02-18 DIAGNOSIS — F17200 Nicotine dependence, unspecified, uncomplicated: Secondary | ICD-10-CM

## 2023-02-18 DIAGNOSIS — Z800 Family history of malignant neoplasm of digestive organs: Secondary | ICD-10-CM | POA: Insufficient documentation

## 2023-02-18 DIAGNOSIS — E611 Iron deficiency: Secondary | ICD-10-CM

## 2023-02-18 NOTE — Patient Instructions (Signed)
Call [phone] to schedule an appointment at [facility].    Mammograms can also be self-scheduled online through MyChart.  

## 2023-02-18 NOTE — Progress Notes (Signed)
42 y.o. [id number] Significant Other Black or African American female here for annual exam.  Doing well.  Denies vaginal bleeding.  Has new sexual partner this past year but this relationship ended.    Patient's last menstrual period was 01/23/2022 (exact date).          Sexually active: Yes.    The current method of family planning is status post hysterectomy.    Smoker:  yes  Health Maintenance: Pap:  05/22/2021 Negative History of abnormal Pap:  no MMG:  02/09/2022 Negative Colonoscopy:  will check with GI about screening recommendatiosn tiven family hx Screening Labs: 12/2020   reports that she has been smoking cigarettes. She has been smoking an average of .25 packs per day. She has never used smokeless tobacco. She reports that she does not currently use alcohol. She reports current drug use. Drug: Marijuana.  Past Medical History:  Diagnosis Date   Anemia    taking oral iron supplements as of 01/16/2022   Anxiety    ARTHRITIS, RIGHT KNEE 08/16/2008   Qualifier: Diagnosis of  By: Lodema Hong MD, Margaret     Bipolar 1 disorder [facility])    Depression    Dysmenorrhea    GERD (gastroesophageal reflux disease)    occasional    Past Surgical History:  Procedure Laterality Date   CESAREAN SECTION  2000   CHOLECYSTECTOMY  2002   CYSTOSCOPY N/A 01/23/2022   Procedure: CYSTOSCOPY;  Surgeon: Jerene Bears, MD;  Location: [facility];  Service: Gynecology;  Laterality: N/A;   TOTAL LAPAROSCOPIC HYSTERECTOMY WITH SALPINGECTOMY Bilateral 01/23/2022   Procedure: TOTAL LAPAROSCOPIC HYSTERECTOMY WITH SALPINGECTOMY;  Surgeon: Jerene Bears, MD;  Location: [facility];  Service: Gynecology;  Laterality: Bilateral;    Current Outpatient Medications  Medication Sig Dispense Refill   acetaminophen (TYLENOL) 325 MG tablet Take 650 mg by mouth every 6 (six) hours as needed. (Patient not taking: Reported on 03/02/2022)     dicyclomine (BENTYL) 20 MG tablet Take 1 tablet (20  mg total) by mouth 2 (two) times daily. (Patient not taking: Reported on 03/02/2022) 10 tablet 0   escitalopram (LEXAPRO) 10 MG tablet 1/2 tab PO daily x 1 mth than 1 tab PO daily.  Stop Zoloft (Patient not taking: Reported on 02/18/2023) 30 tablet 1   famotidine (PEPCID) 20 MG tablet Take 1 tablet (20 mg total) by mouth 2 (two) times daily. (Patient not taking: Reported on 03/02/2022) 30 tablet 0   hydrOXYzine (VISTARIL) 25 MG capsule Take 1 capsule (25 mg total) by mouth 2 (two) times daily as needed. (Patient not taking: Reported on 02/18/2023) 40 capsule 1   Oxcarbazepine (TRILEPTAL) 300 MG tablet Take 0.5 tablets (150 mg total) by mouth 2 (two) times daily. (Patient not taking: Reported on 03/02/2022) 60 tablet 3   No current facility-administered medications for this visit.    Family History  Problem Relation Age of Onset   Hypertension Mother    Diabetes Mother    Heart disease Mother    Colon cancer Maternal Aunt 38       died age 67    ROS: Constitutional:  reports some fatigue Genitourinary:negative  Exam:   BP 125/65 (BP Location: Left Arm, Patient Position: Sitting, Cuff Size: Large)   Pulse 87   Ht 4\' 11"  (1.499 m) Comment: Reported  Wt 183 lb (83 kg)   LMP 01/23/2022 (Exact Date)   BMI 36.96 kg/m   Height: 4\' 11"  (149.9 cm) (Reported)  General  appearance: alert, cooperative and appears stated age Head: Normocephalic, without obvious abnormality, atraumatic Neck: no adenopathy, supple, symmetrical, trachea midline and thyroid normal to inspection and palpation Lungs: clear to auscultation bilaterally Breasts: normal appearance, no masses or tenderness Heart: regular rate and rhythm Abdomen: soft, non-tender; bowel sounds normal; no masses,  no organomegaly Extremities: extremities normal, atraumatic, no cyanosis or edema Skin: Skin color, texture, turgor normal. No rashes or lesions Lymph nodes: Cervical, supraclavicular, and axillary nodes normal. No abnormal  inguinal nodes palpated Neurologic: Grossly normal   Pelvic: External genitalia:  no lesions              Urethra:  normal appearing urethra with no masses, tenderness or lesions              Bartholins and Skenes: normal                 Vagina: normal appearing vagina with normal color and no discharge, no lesions              Cervix: absent              Pap taken: No. Bimanual Exam:  Uterus:  uterus absent              Adnexa: no mass, fullness, tenderness               Rectovaginal: Confirms               Anus:  normal sphincter tone, no lesions  Chaperone, Ina Homes, CMA, was present for exam.  Assessment/Plan: 1. Well woman exam with routine gynecological exam - Pap smear neg with neg HR HPV 2022.  Not indicated today - Mammogram ordered.  Pt aware this is due.   - Colonoscopy guidelines reviewed.  Will check with GI about screening due to maternal aunt's hx of colon cancer - lab work done 12/2020 - vaccines reviewed/updated  2. Screening examination for STD (sexually transmitted disease) - RPR+HBsAg+HIV - Hepatitis C antibody - Cervicovaginal ancillary only  3. Vaginal odor - Cervicovaginal ancillary only  4. Iron deficiency - Iron, TIBC and Ferritin Panel  5. Encounter for screening mammogram for malignant neoplasm of breast - MM 3D SCREENING MAMMOGRAM BILATERAL BREAST; Future  6. Tobacco dependence - not interested in quitting at this time  7. Family history of colon cancer - in her maternal aunt diagnosed age 42

## 2023-02-19 LAB — CERVICOVAGINAL ANCILLARY ONLY
Bacterial Vaginitis (gardnerella): POSITIVE — AB
Candida Glabrata: NEGATIVE
Candida Vaginitis: NEGATIVE
Chlamydia: NEGATIVE
Comment: NEGATIVE
Comment: NEGATIVE
Comment: NEGATIVE
Comment: NEGATIVE
Comment: NEGATIVE
Comment: NORMAL
Neisseria Gonorrhea: NEGATIVE
Trichomonas: POSITIVE — AB

## 2023-02-19 LAB — IRON,TIBC AND FERRITIN PANEL
Ferritin: 71 ng/mL (ref 15–150)
Iron Saturation: 23 % (ref 15–55)
Iron: 71 ug/dL (ref 27–159)
Total Iron Binding Capacity: 308 ug/dL (ref 250–450)
UIBC: 237 ug/dL (ref 131–425)

## 2023-02-19 LAB — HEPATITIS C ANTIBODY: Hep C Virus Ab: NONREACTIVE

## 2023-02-19 LAB — RPR+HBSAG+HIV
HIV Screen 4th Generation wRfx: NONREACTIVE
Hepatitis B Surface Ag: NEGATIVE
RPR Ser Ql: NONREACTIVE

## 2023-02-21 ENCOUNTER — Other Ambulatory Visit (HOSPITAL_BASED_OUTPATIENT_CLINIC_OR_DEPARTMENT_OTHER): Payer: Self-pay | Admitting: Obstetrics & Gynecology

## 2023-02-21 MED ORDER — METRONIDAZOLE 500 MG PO TABS: 500.0000 mg | ORAL_TABLET | Freq: Two times a day (BID) | ORAL | 0 refills | Status: AC

## 2023-03-06 ENCOUNTER — Encounter (HOSPITAL_BASED_OUTPATIENT_CLINIC_OR_DEPARTMENT_OTHER): Payer: Self-pay | Admitting: Obstetrics & Gynecology

## 2023-03-16 ENCOUNTER — Ambulatory Visit (HOSPITAL_BASED_OUTPATIENT_CLINIC_OR_DEPARTMENT_OTHER)
Admission: RE | Admit: 2023-03-16 | Discharge: 2023-03-16 | Disposition: A | Discharge status: Home / Self Care | Payer: Self-pay | Source: Ambulatory Visit | Attending: Obstetrics & Gynecology | Admitting: Obstetrics & Gynecology

## 2023-03-16 DIAGNOSIS — Z1231 Encounter for screening mammogram for malignant neoplasm of breast: Secondary | ICD-10-CM | POA: Insufficient documentation

## 2023-03-21 NOTE — Telephone Encounter (Signed)
Informed pt that Dr. Hyacinth Meeker spoke with Dr. Leone Payor about colonoscopy screening for her and he recommended age 42. Pt verbalized understanding .

## 2023-03-28 ENCOUNTER — Encounter (HOSPITAL_BASED_OUTPATIENT_CLINIC_OR_DEPARTMENT_OTHER): Payer: Self-pay

## 2023-03-28 ENCOUNTER — Ambulatory Visit (HOSPITAL_BASED_OUTPATIENT_CLINIC_OR_DEPARTMENT_OTHER): Payer: Medicaid Other

## 2023-03-28 ENCOUNTER — Other Ambulatory Visit (HOSPITAL_COMMUNITY)
Admission: RE | Admit: 2023-03-28 | Discharge: 2023-03-28 | Disposition: A | Discharge status: Home / Self Care | Payer: Medicaid Other | Source: Ambulatory Visit | Attending: Obstetrics & Gynecology | Admitting: Obstetrics & Gynecology

## 2023-03-28 VITALS — BP 141/81 | HR 75 | Ht 60.0 in | Wt 184.2 lb

## 2023-03-28 DIAGNOSIS — Z8619 Personal history of other infectious and parasitic diseases: Secondary | ICD-10-CM | POA: Diagnosis not present

## 2023-03-28 NOTE — Progress Notes (Signed)
Patient came in  today to give an aptima self swab. Patient states she is having no symptoms right now. tbw

## 2023-03-29 LAB — CERVICOVAGINAL ANCILLARY ONLY
Comment: NEGATIVE
Trichomonas: NEGATIVE

## 2024-03-02 ENCOUNTER — Ambulatory Visit (HOSPITAL_BASED_OUTPATIENT_CLINIC_OR_DEPARTMENT_OTHER): Payer: Medicaid Other | Admitting: Obstetrics & Gynecology

## 2024-07-10 ENCOUNTER — Emergency Department (HOSPITAL_COMMUNITY)
Admission: EM | Admit: 2024-07-10 | Discharge: 2024-07-10 | Disposition: A | Discharge status: Home / Self Care | Attending: Emergency Medicine | Admitting: Emergency Medicine

## 2024-07-10 ENCOUNTER — Emergency Department (HOSPITAL_COMMUNITY)

## 2024-07-10 ENCOUNTER — Encounter (HOSPITAL_COMMUNITY): Payer: Self-pay | Admitting: *Deleted

## 2024-07-10 ENCOUNTER — Other Ambulatory Visit: Payer: Self-pay

## 2024-07-10 DIAGNOSIS — R0789 Other chest pain: Secondary | ICD-10-CM | POA: Diagnosis not present

## 2024-07-10 DIAGNOSIS — S161XXA Strain of muscle, fascia and tendon at neck level, initial encounter: Secondary | ICD-10-CM | POA: Insufficient documentation

## 2024-07-10 DIAGNOSIS — Y92410 Unspecified street and highway as the place of occurrence of the external cause: Secondary | ICD-10-CM | POA: Diagnosis not present

## 2024-07-10 DIAGNOSIS — M545 Low back pain, unspecified: Secondary | ICD-10-CM | POA: Insufficient documentation

## 2024-07-10 DIAGNOSIS — M546 Pain in thoracic spine: Secondary | ICD-10-CM | POA: Diagnosis not present

## 2024-07-10 DIAGNOSIS — S199XXA Unspecified injury of neck, initial encounter: Secondary | ICD-10-CM | POA: Diagnosis present

## 2024-07-10 DIAGNOSIS — M25511 Pain in right shoulder: Secondary | ICD-10-CM | POA: Insufficient documentation

## 2024-07-10 LAB — CBC
HCT: 43.4 % (ref 36.0–46.0)
Hemoglobin: 14.5 g/dL (ref 12.0–15.0)
MCH: 31.3 pg (ref 26.0–34.0)
MCHC: 33.4 g/dL (ref 30.0–36.0)
MCV: 93.5 fL (ref 80.0–100.0)
Platelets: 264 K/uL (ref 150–400)
RBC: 4.64 MIL/uL (ref 3.87–5.11)
RDW: 13.3 % (ref 11.5–15.5)
WBC: 10.2 K/uL (ref 4.0–10.5)
nRBC: 0 % (ref 0.0–0.2)

## 2024-07-10 LAB — COMPREHENSIVE METABOLIC PANEL WITH GFR
ALT: 11 U/L (ref 0–44)
AST: 17 U/L (ref 15–41)
Albumin: 3.5 g/dL (ref 3.5–5.0)
Alkaline Phosphatase: 64 U/L (ref 38–126)
Anion gap: 10 (ref 5–15)
BUN: 13 mg/dL (ref 6–20)
CO2: 25 mmol/L (ref 22–32)
Calcium: 9 mg/dL (ref 8.9–10.3)
Chloride: 104 mmol/L (ref 98–111)
Creatinine, Ser: 0.84 mg/dL (ref 0.44–1.00)
GFR, Estimated: >60 mL/min (ref >60)
Glucose, Bld: 102 mg/dL — ABNORMAL HIGH (ref 70–99)
Potassium: 4 mmol/L (ref 3.5–5.1)
Sodium: 139 mmol/L (ref 135–145)
Total Bilirubin: 0.5 mg/dL (ref 0.0–1.2)
Total Protein: 6.5 g/dL (ref 6.5–8.1)

## 2024-07-10 LAB — I-STAT CHEM 8, ED
BUN: 17 mg/dL (ref 6–20)
Calcium, Ion: 1.21 mmol/L (ref 1.15–1.40)
Chloride: 103 mmol/L (ref 98–111)
Creatinine, Ser: 0.9 mg/dL (ref 0.44–1.00)
Glucose, Bld: 90 mg/dL (ref 70–99)
HCT: 44 % (ref 36.0–46.0)
Hemoglobin: 15 g/dL (ref 12.0–15.0)
Potassium: 4 mmol/L (ref 3.5–5.1)
Sodium: 140 mmol/L (ref 135–145)
TCO2: 26 mmol/L (ref 22–32)

## 2024-07-10 LAB — URINALYSIS, ROUTINE W REFLEX MICROSCOPIC
Bilirubin Urine: NEGATIVE
Glucose, UA: NEGATIVE mg/dL
Hgb urine dipstick: NEGATIVE
Ketones, ur: NEGATIVE mg/dL
Leukocytes,Ua: NEGATIVE
Nitrite: NEGATIVE
Protein, ur: NEGATIVE mg/dL
Specific Gravity, Urine: 1.016 (ref 1.005–1.030)
pH: 7 (ref 5.0–8.0)

## 2024-07-10 LAB — PROTIME-INR
INR: 1 (ref 0.8–1.2)
Prothrombin Time: 14 s (ref 11.4–15.2)

## 2024-07-10 LAB — I-STAT CG4 LACTIC ACID, ED: Lactic Acid, Venous: 1.1 mmol/L (ref 0.5–1.9)

## 2024-07-10 LAB — SAMPLE TO BLOOD BANK

## 2024-07-10 LAB — ETHANOL: Alcohol, Ethyl (B): <15 mg/dL (ref <15)

## 2024-07-10 MED ORDER — NAPROXEN 500 MG PO TABS: 500.0000 mg | ORAL_TABLET | Freq: Two times a day (BID) | ORAL | 0 refills | Status: AC

## 2024-07-10 MED ORDER — CYCLOBENZAPRINE HCL 5 MG PO TABS: 5.0000 mg | ORAL_TABLET | Freq: Three times a day (TID) | ORAL | 0 refills | Status: AC | PRN

## 2024-07-10 MED ORDER — LIDOCAINE 5 % EX PTCH
1.0000 | MEDICATED_PATCH | CUTANEOUS | Status: DC
  Administered 2024-07-10: 1 via TRANSDERMAL
  Filled 2024-07-10: qty 1

## 2024-07-10 MED ORDER — IOHEXOL 350 MG/ML SOLN
75.0000 mL | Freq: Once | INTRAVENOUS | Status: AC | PRN
  Administered 2024-07-10: 75 mL via INTRAVENOUS

## 2024-07-10 MED ORDER — LIDOCAINE 5 % EX OINT: 1.0000 | TOPICAL_OINTMENT | CUTANEOUS | 0 refills | Status: AC | PRN

## 2024-07-10 MED ORDER — HYDROCODONE-ACETAMINOPHEN 5-325 MG PO TABS
1.0000 | ORAL_TABLET | Freq: Once | ORAL | Status: AC
  Administered 2024-07-10: 1 via ORAL
  Filled 2024-07-10: qty 1

## 2024-07-10 MED ORDER — TETANUS-DIPHTH-ACELL PERTUSSIS 5-2-15.5 LF-MCG/0.5 IM SUSP
0.5000 mL | Freq: Once | INTRAMUSCULAR | Status: AC
  Administered 2024-07-10: 0.5 mL via INTRAMUSCULAR
  Filled 2024-07-10: qty 0.5

## 2024-07-10 MED ORDER — FENTANYL CITRATE (PF) 50 MCG/ML IJ SOSY
50.0000 ug | PREFILLED_SYRINGE | Freq: Once | INTRAMUSCULAR | Status: AC
  Administered 2024-07-10: 50 ug via INTRAVENOUS
  Filled 2024-07-10: qty 1
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
## 2024-07-10 NOTE — ED Notes (Signed)
 Delay in CT d/t difficult IV start.
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
## 2024-07-15 ENCOUNTER — Ambulatory Visit: Payer: Self-pay
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
## 2024-07-15 NOTE — Telephone Encounter (Signed)
 Noted

## 2024-07-16 ENCOUNTER — Inpatient Hospital Stay: Payer: Self-pay
